# Patient Record
Sex: Male | Born: 1985 | Race: Black or African American | Hispanic: No | Marital: Single | Smoking: Never smoker
Health system: Southern US, Community
[De-identification: ages and names within clinical notes are randomized; demographics above are authoritative.]

## PROBLEM LIST (undated history)

## (undated) DIAGNOSIS — J45909 Unspecified asthma, uncomplicated: Secondary | ICD-10-CM

---

## 2011-09-24 ENCOUNTER — Encounter (HOSPITAL_BASED_OUTPATIENT_CLINIC_OR_DEPARTMENT_OTHER): Payer: Self-pay | Admitting: *Deleted

## 2011-09-24 ENCOUNTER — Emergency Department (HOSPITAL_BASED_OUTPATIENT_CLINIC_OR_DEPARTMENT_OTHER)
Admission: EM | Admit: 2011-09-24 | Discharge: 2011-09-24 | Disposition: A | Discharge status: Home / Self Care | Payer: Self-pay | Attending: Emergency Medicine | Admitting: Emergency Medicine

## 2011-09-24 DIAGNOSIS — Y9239 Other specified sports and athletic area as the place of occurrence of the external cause: Secondary | ICD-10-CM | POA: Insufficient documentation

## 2011-09-24 DIAGNOSIS — IMO0002 Reserved for concepts with insufficient information to code with codable children: Secondary | ICD-10-CM

## 2011-09-24 DIAGNOSIS — S0180XA Unspecified open wound of other part of head, initial encounter: Secondary | ICD-10-CM | POA: Insufficient documentation

## 2011-09-24 MED ORDER — LIDOCAINE HCL 2 % IJ SOLN
5.0000 mL | Freq: Once | INTRAMUSCULAR | Status: AC
  Administered 2011-09-24: 100 mg via INTRADERMAL

## 2011-09-24 MED ORDER — HYDROCODONE-ACETAMINOPHEN 5-325 MG PO TABS
1.0000 | ORAL_TABLET | Freq: Once | ORAL | Status: AC
  Administered 2011-09-24: 1 via ORAL
  Filled 2011-09-24: qty 1

## 2011-09-24 MED ORDER — LIDOCAINE-EPINEPHRINE 2 %-1:100000 IJ SOLN: 20.0000 mL | Freq: Once | INTRAMUSCULAR | Status: DC

## 2011-09-24 NOTE — ED Notes (Signed)
Pt presents to ED today with full thickness lac above right eyebrow.  EMS bandage and bleeding is controlled at present

## 2011-09-24 NOTE — Discharge Instructions (Signed)
Laceration Care, Adult °A laceration is a cut that goes through all layers of the skin. The cut goes into the tissue beneath the skin. °HOME CARE °For stitches (sutures) or staples: °· Keep the cut clean and dry.  °· If you have a bandage (dressing), change it at least once a day. Change the bandage if it gets wet or dirty, or as told by your doctor.  °· Wash the cut with soap and water 2 times a day. Rinse the cut with water. Pat it dry with a clean towel.  °· Put a thin layer of medicated cream on the cut as told by your doctor.  °· You may shower after the first 24 hours. Do not soak the cut in water until the stitches are removed.  °· Only take medicines as told by your doctor.  °· Have your stitches or staples removed as told by your doctor.  °For skin adhesive strips: °· Keep the cut clean and dry.  °· Do not get the strips wet. You may take a bath, but be careful to keep the cut dry.  °· If the cut gets wet, pat it dry with a clean towel.  °· The strips will fall off on their own. Do not remove the strips that are still stuck to the cut.  °For wound glue: °· You may shower or take baths. Do not soak or scrub the cut. Do not swim. Avoid heavy sweating until the glue falls off on its own. After a shower or bath, pat the cut dry with a clean towel.  °· Do not put medicine on your cut until the glue falls off.  °· If you have a bandage, do not put tape over the glue.  °· Avoid lots of sunlight or tanning lamps until the glue falls off. Put sunscreen on the cut for the first year to reduce your scar.  °· The glue will fall off on its own. Do not pick at the glue.  °You may need a tetanus shot if: °· You cannot remember when you had your last tetanus shot.  °· You have never had a tetanus shot.  °If you need a tetanus shot and you choose not to have one, you may get tetanus. Sickness from tetanus can be serious. °GET HELP RIGHT AWAY IF:  °· Your pain does not get better with medicine.  °· Your arm, hand, leg, or  foot loses feeling (numbness) or changes color.  °· Your cut is bleeding.  °· Your joint feels weak, or you cannot use your joint.  °· You have painful lumps on your body.  °· Your cut is red, puffy (swollen), or painful.  °· You have a red line on the skin near the cut.  °· You have yellowish-white fluid (pus) coming from the cut.  °· You have a fever.  °· You have a bad smell coming from the cut or bandage.  °· Your cut breaks open before or after stitches are removed.  °· You notice something coming out of the cut, such as wood or glass.  °· You cannot move a finger or toe.  °MAKE SURE YOU:  °· Understand these instructions.  °· Will watch your condition.  °· Will get help right away if you are not doing well or get worse.  °Document Released: 11/02/2007 Document Revised: 05/05/2011 Document Reviewed: 11/09/2010 °Exit[employer]® Patient Information ©2012 [employer]. °

## 2011-09-24 NOTE — ED Notes (Signed)
I irrigated wound with saline before PA sutured wound. I then completed wound care with bacitracin and 2x2's secured with paper tape. I cleaned remaining dried blood with hydrogen peroxide.
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
## 2011-09-29 ENCOUNTER — Encounter (HOSPITAL_BASED_OUTPATIENT_CLINIC_OR_DEPARTMENT_OTHER): Payer: Self-pay | Admitting: Emergency Medicine

## 2011-09-29 ENCOUNTER — Emergency Department (HOSPITAL_BASED_OUTPATIENT_CLINIC_OR_DEPARTMENT_OTHER)
Admission: EM | Admit: 2011-09-29 | Discharge: 2011-09-29 | Disposition: A | Discharge status: Home / Self Care | Payer: Self-pay | Attending: Emergency Medicine | Admitting: Emergency Medicine

## 2011-09-29 DIAGNOSIS — Z4802 Encounter for removal of sutures: Secondary | ICD-10-CM | POA: Insufficient documentation

## 2011-09-29 DIAGNOSIS — IMO0002 Reserved for concepts with insufficient information to code with codable children: Secondary | ICD-10-CM

## 2011-09-29 NOTE — ED Notes (Signed)
Here for suture removal

## 2011-09-29 NOTE — ED Provider Notes (Signed)
History     CSN: [id number]  Arrival date & time 09/29/11  1557   First MD Initiated Contact with Patient 09/29/11 1604      Chief Complaint  Patient presents with  . Suture / Staple Removal    (Consider location/radiation/quality/duration/timing/severity/associated sxs/prior treatment) Patient is a 26 y.o. male presenting with suture removal. The history is provided by the patient. No language interpreter was used.  Suture / Staple Removal  The sutures were placed 3 to 6 days ago. There has been no treatment since the wound repair. There has been no drainage from the wound. There is no redness present. There is no swelling present. The pain has no pain.  healed laceration forehead  History reviewed. No pertinent past medical history.  History reviewed. No pertinent past surgical history.  No family history on file.  History  Substance Use Topics  . Smoking status: Never Smoker   . Smokeless tobacco: Not on file  . Alcohol Use: No      Review of Systems  All other systems reviewed and are negative.    Allergies  Sulfa antibiotics  Home Medications   Current Outpatient Rx  Name Route Sig Dispense Refill  . ASPIRIN 325 MG PO TABS Oral Take 325 mg by mouth daily. Patient used this medication for his headache.      BP 119/68  Pulse 76  Temp(Src) 98.6 F (37 C) (Oral)  Resp 16  Ht 5\' 8"  (1.727 m)  Wt 164 lb (74.39 kg)  BMI 24.94 kg/m2  SpO2 99%  Physical Exam  Nursing note and vitals reviewed. Constitutional: He is oriented to person, place, and time. He appears well-developed and well-nourished.  HENT:  Head: Normocephalic and atraumatic.  Right Ear: External ear normal.  Left Ear: External ear normal.  Eyes: Conjunctivae are normal. Pupils are equal, round, and reactive to light.  Musculoskeletal: Normal range of motion.  Neurological: He is alert and oriented to person, place, and time. He has normal reflexes.  Skin: Skin is warm.  Psychiatric: He  has a normal mood and affect.    ED Course  Procedures (including critical care time)  Labs Reviewed - No data to display No results found.   No diagnosis found.    MDM          Elson Areas, PA 09/29/11 1606  Lonia Skinner [place], [place] 09/29/11 [address] [place], [place] 09/29/11 1646  Lonia Skinner [place], [place] 09/29/11 1647  Lonia Skinner [place], [place] 09/29/11 1649

## 2011-09-29 NOTE — Discharge Instructions (Signed)
Suture Removal You have had your sutures (stitches) removed today. This means your wound has healed well enough to take out your stitches. Be careful to protect the wound area over the next several weeks. An injury this area could cause the cut to split open again. It usually takes 1-2 years for a scar to get its full strength and loose its redness. For wounds that heal slowly, tapes may be applied to reinforce the skin for several days after the stitches are removed. You may allow the sutured area to get wet. Topical antibiotics (antibiotics you put on your skin) are not usually needed at this point. Applying vitamin E oil and aloe vera ointments may help the wound heal faster and stronger. Some scars form extra pigment with exposure to sunlight during the first 6-12 months after repair. This can be prevented by using a sun block (SPF 15-30) on the affected area. Call your doctor if you have any concerns about your injury. Call right away if you have any evidence of wound infection such as increased pain, drainage, redness, or swelling. Document Released: 06/23/2004 Document Revised: 05/05/2011 Document Reviewed: 03/07/2008 Exit[employer] Patient Information 2012 [employer]. 

## 2011-09-29 NOTE — ED Provider Notes (Signed)
Medical screening examination/treatment/procedure(s) were performed by non-physician practitioner and as supervising physician I was immediately available for consultation/collaboration.   Aysa Larivee, MD 09/29/11 1701 

## 2013-05-30 HISTORY — PX: FRACTURE SURGERY: SHX138

## 2013-06-24 ENCOUNTER — Ambulatory Visit
Admission: RE | Admit: 2013-06-24 | Discharge: 2013-06-24 | Disposition: A | Discharge status: Home / Self Care | Payer: BC Managed Care – PPO | Source: Ambulatory Visit | Attending: Physician Assistant | Admitting: Physician Assistant

## 2013-06-24 ENCOUNTER — Other Ambulatory Visit: Payer: Self-pay | Admitting: Physician Assistant

## 2013-06-24 DIAGNOSIS — T1490XA Injury, unspecified, initial encounter: Secondary | ICD-10-CM

## 2013-09-07 ENCOUNTER — Emergency Department (HOSPITAL_COMMUNITY): Payer: BC Managed Care – PPO

## 2013-09-07 ENCOUNTER — Emergency Department (HOSPITAL_COMMUNITY)
Admission: EM | Admit: 2013-09-07 | Discharge: 2013-09-08 | Disposition: A | Discharge status: Home / Self Care | Payer: BC Managed Care – PPO | Attending: Emergency Medicine | Admitting: Emergency Medicine

## 2013-09-07 ENCOUNTER — Encounter (HOSPITAL_COMMUNITY): Payer: Self-pay | Admitting: Emergency Medicine

## 2013-09-07 DIAGNOSIS — Y92838 Other recreation area as the place of occurrence of the external cause: Secondary | ICD-10-CM

## 2013-09-07 DIAGNOSIS — Y9239 Other specified sports and athletic area as the place of occurrence of the external cause: Secondary | ICD-10-CM | POA: Insufficient documentation

## 2013-09-07 DIAGNOSIS — Y9389 Activity, other specified: Secondary | ICD-10-CM | POA: Insufficient documentation

## 2013-09-07 DIAGNOSIS — Z7982 Long term (current) use of aspirin: Secondary | ICD-10-CM | POA: Insufficient documentation

## 2013-09-07 DIAGNOSIS — W010XXA Fall on same level from slipping, tripping and stumbling without subsequent striking against object, initial encounter: Secondary | ICD-10-CM | POA: Insufficient documentation

## 2013-09-07 DIAGNOSIS — S8253XA Displaced fracture of medial malleolus of unspecified tibia, initial encounter for closed fracture: Secondary | ICD-10-CM | POA: Insufficient documentation

## 2013-09-07 DIAGNOSIS — R000 Tachycardia, unspecified: Secondary | ICD-10-CM | POA: Insufficient documentation

## 2013-09-07 DIAGNOSIS — S82832A Other fracture of upper and lower end of left fibula, initial encounter for closed fracture: Secondary | ICD-10-CM

## 2013-09-07 DIAGNOSIS — S8252XA Displaced fracture of medial malleolus of left tibia, initial encounter for closed fracture: Secondary | ICD-10-CM

## 2013-09-07 DIAGNOSIS — S82839B Other fracture of upper and lower end of unspecified fibula, initial encounter for open fracture type I or II: Secondary | ICD-10-CM | POA: Insufficient documentation

## 2013-09-07 MED ORDER — HYDROCODONE-ACETAMINOPHEN 5-325 MG PO TABS
1.0000 | ORAL_TABLET | Freq: Once | ORAL | Status: AC
  Administered 2013-09-07: 1 via ORAL
  Filled 2013-09-07: qty 1

## 2013-09-07 NOTE — ED Provider Notes (Signed)
CSN: [id number]     Arrival date & time 09/07/13  2126 History  This chart was scribed for non-physician practitioner Johnnette Gourdobyn Albert, PA working with Richardean Canalavid H Yao, MD by Elveria Risingimelie Horne, ED Scribe. This patient was seen in room TR06C/TR06C and the patient's care was started at 10:31 PM.   Chief Complaint  Patient presents with  . Ankle Pain      The history is provided by the patient. No language interpreter was used.   HPI Comments: Brian Carney is a 28 y.o. male who presents to the Emergency Department complaining of sharp, throbbing left ankle pain due to injury sustained while participating in cross fit. Patient reports falling and injuring his ankle during his excercise. Patient denies taking any medication but says the pain has mildly subsided some since the incident. Patient reports numbness and tingling in the foot.    History reviewed. No pertinent past medical history. History reviewed. No pertinent past surgical history. History reviewed. No pertinent family history. History  Substance Use Topics  . Smoking status: Never Smoker   . Smokeless tobacco: Never Used  . Alcohol Use: No    Review of Systems    Allergies  Sulfa antibiotics  Home Medications   Current Outpatient Rx  Name  Route  Sig  Dispense  Refill  . aspirin 325 MG tablet   Oral   Take 325 mg by mouth daily. Patient used this medication for his headache.         . oxyCODONE-acetaminophen (PERCOCET) 5-325 MG per tablet   Oral   Take 1-2 tablets by mouth every 6 (six) hours as needed for severe pain.   15 tablet   0    Triage Vitals: BP 118/74  Pulse 102  Temp(Src) 97.5 F (36.4 C) (Oral)  Resp 18  Ht 5\' 8"  (1.727 m)  Wt 174 lb (78.926 kg)  BMI 26.46 kg/m2  SpO2 97%  Physical Exam  Nursing note and vitals reviewed. Constitutional: He is oriented to person, place, and time. He appears well-developed and well-nourished. No distress.  HENT:  Head: Normocephalic and atraumatic.  Eyes:  Conjunctivae and EOM are normal.  Neck: Normal range of motion. Neck supple.  Cardiovascular: Regular rhythm and normal heart sounds.   Tachy ~100.  Pulmonary/Chest: Effort normal and breath sounds normal.  Musculoskeletal: Normal range of motion. He exhibits tenderness. He exhibits no edema.       Left ankle: He exhibits deformity.  Significant swelling over medial and lateral malleolus. Tender over entire ankle. +2 DP and TP pulses. ROM limited by pain and swelling. No tenderness over proximal fibula.  Neurological: He is alert and oriented to person, place, and time.  Skin: Skin is warm and dry.  Psychiatric: He has a normal mood and affect. His behavior is normal.    ED Course  Procedures (including critical care time) DIAGNOSTIC STUDIES: Oxygen Saturation is 97% on room air, adequate by my interpretation.    COORDINATION OF CARE: 10:36 PM- Will order X-ray of left foot. Discussed treatment plan with patient at bedside and patient agreed to plan.    Labs Review Labs Reviewed - No data to display Imaging Review Dg Tibia/fibula Left  09/08/2013   CLINICAL DATA:  Accident during cross fit training.  EXAM: LEFT TIBIA AND FIBULA - 2 VIEW  COMPARISON:  None.  FINDINGS: Minimally displaced, comminuted fracture involves the proximal left fibula. Displaced fracture of the medial malleolus with associated soft tissue swelling is again noted.  IMPRESSION:  1. Proximal fibular fracture. 2. Medial malleolar fracture.   Electronically Signed   By: Signa Kell M.D.   On: 09/08/2013 00:50   Dg Ankle Complete Left  09/07/2013   CLINICAL DATA:  Trauma. Fell during sports with left ankle pain and swelling.  EXAM: LEFT ANKLE COMPLETE - 3+ VIEW  COMPARISON:  None.  FINDINGS: Acute fracture of the left medial malleolus with associated soft tissue swelling. Fracture fragment is displaced. There is suggestion of a tiny posterior malleolar avulsion fracture. Lateral malleolus appears intact. Widening  of the medial tibiotalar joint space.  IMPRESSION: Displaced fracture of the medial malleolus with soft tissue swelling. Suggestion of tiny avulsion of the posterior malleolus.   Electronically Signed   By: Burman Nieves M.D.   On: 09/07/2013 23:02   Dg Knee Complete 4 Views Left  09/08/2013   CLINICAL DATA:  Left ankle and tib fib pain  EXAM: LEFT KNEE - COMPLETE 4+ VIEW  COMPARISON:  None.  FINDINGS: No joint effusion. There is a comminuted fracture involving the fibula. There is mild posterior displacement of the distal fracture fragments.  IMPRESSION: 1. Comminuted fracture involves the proximal shaft of the fibula.   Electronically Signed   By: Signa Kell M.D.   On: 09/08/2013 00:31     EKG Interpretation None      MDM   Final diagnoses:  Displaced fracture of medial malleolus of left tibia  Closed fracture of fibula, proximal, left    Patient presenting with left ankle injury. He is well appearing and in no apparent distress. Neurovascularly intact. X-rays showing displaced fracture of the medial malleolus with soft tissue swelling, suggestion of tiny avulsion of the posterior malleolus. I spoke with Dr. Merlyn Lot, orthopedic surgeon on-call who suggested left knee x-ray as well despite patient not having tenderness of his proximal fibula. X-ray showing a comminuted fracture involving the proximal shaft of the fibula. He advised a posterior leg splint, nonweightbearing with crutches, followup in his office on Monday. Pain meds given. Patient stable for discharge. Return precautions given. Patient states understanding of treatment care plan and is agreeable. Case discussed with attending Dr. Silverio Lay who agrees with plan of care.   I personally performed the services described in this documentation, which was scribed in my presence. The recorded information has been reviewed and is accurate.    Brian Mace, PA-C 09/08/13 0130

## 2013-09-07 NOTE — ED Notes (Signed)
Pt states he tripped and fell while box jumping and hurt his L ankle. Edema noted to L ankle.

## 2013-09-07 NOTE — ED Notes (Signed)
Left ankle swelling noted. Pt states injury due to exercise cross fit. Pt can wiggle digits and move ankle. Skin warm, cap refill less than 2 seconds, skin color normal for race. Sensation present.

## 2013-09-08 ENCOUNTER — Emergency Department (HOSPITAL_COMMUNITY): Payer: BC Managed Care – PPO

## 2013-09-08 MED ORDER — OXYCODONE-ACETAMINOPHEN 5-325 MG PO TABS: 1.0000 | ORAL_TABLET | Freq: Four times a day (QID) | ORAL | Status: DC | PRN

## 2013-09-08 NOTE — ED Provider Notes (Signed)
Medical screening examination/treatment/procedure(s) were performed by non-physician practitioner and as supervising physician I was immediately available for consultation/collaboration.   EKG Interpretation None        Richardean Canalavid H Yao, MD 09/08/13 1510

## 2013-09-08 NOTE — Discharge Instructions (Signed)
Take percocet for severe pain only. No driving or operating heavy machinery while taking percocet. This medication may cause drowsiness.  Tibial Fracture, Ankle, Adult, Displaced, ORIF You have a fracture (break) of your tibia in the lower part of the bone that makes up part of your ankle. The tibia is the large "shin" bone in your lower leg. This is the large bump you feel at your ankle on the inside of your leg. Your fracture is displaced. This means the bones are not in their normal position and will not give a good result if they heal in that position. Because of this, surgery is required. This is called an open reduction and internal fixation (ORIF). Even with the best of care and perfect results this ankle may be more prone to be arthritis later due to damage of the cartilage lining the ankle joint which is not visible on X-ray. These fractures are easily diagnosed with X-rays. TREATMENT  You have a fracture that would probably heal with disability, without surgery. Open reduction means that the area of the fracture is opened up to the vision of the surgeon and internal fixation means that a screw or fixation device is used to hold the boney fragment (piece) in place. Following surgery a short-leg cast or removable fracture boot is then applied from below your knee to your toes. This is generally left in place for about 5 to 6 weeks, during which time it is followed by your caregiver and X-rays may be taken to make sure the bones stay in place. LET YOUR CAREGIVER KNOW ABOUT:   Allergies.  Medications taken including herbs, eye drops, over the counter medications, and creams.  Use of steroids (by mouth or creams).  History of bleeding or blood problems.  Previous problems with anesthetics or novocaine, including a family history of these problems.  Possibility of pregnancy, if this applies.  History of blood clots (thrombophlebitis).  Previous surgery.  Other health problems. RISKS AND  COMPLICATIONS All surgery is associated with risks. Some of these risks are:  Excessive bleeding.  Infection.  Failure to heal properly resulting in an unstable or arthritic ankle.  Stiffness of ankle following repair. BEFORE AND AFTER YOUR SURGERY Prior to surgery an IV (intravenous line connected to your vein for giving fluids) may be started and you will be given an anesthetic (medications and gas to make you sleep). After surgery, you will be taken to the recovery area where a nurse will monitor your progress. You may have a catheter (a long, narrow, hollow tube) in your bladder following surgery that helps you pass your water. When you are awake, are stable, taking fluids well and without complications, you will be returned to your room. You will receive physical therapy and other care until you are doing well and your caregiver feels it is safe for you to be transferred either to home or to an extended care facility. HOME CARE INSTRUCTIONS  You may resume normal diet and activities as directed or allowed.  Keep ice packs (a bag of ice wrapped in a towel) on the surgical area for 15-20 minutes, 03-04 times per day, for the first two days following surgery. Use the ice only if OK with your surgeon or caregiver.  Elevate your ankle above your heart as much as possible for the first 24-48 hrs post-operatively.  Change dressings if necessary or as directed.  If you have a plaster or fiberglass cast:  Do not try to scratch the skin under  the cast using sharp or pointed objects.  Check the skin around the cast every day. You may put lotion on any red or sore areas.  Keep your cast dry and clean.  Do not put pressure on any part of your cast or splint until it is fully hardened.  Your cast or splint can be protected during bathing with a plastic bag. Do not lower the cast or splint into water.  Only take over-the-counter or prescription medicines for pain, discomfort, or fever as  directed by your caregiver.  Use crutches as directed and do not exercise leg unless instructed.  These are not fractures to be taken lightly! If these bones become displaced and get out of position, it may eventually lead to arthritis and disability for the rest of your life. Problems often follow even the best of care. Follow the directions of your caregiver.  Keep appointments as directed. SEEK IMMEDIATE MEDICAL CARE IF:  There is redness, swelling, or increasing pain in the wound.  There is pus coming from wound.  You have an unexplained oral temperature above 102 F (38.9 C) develops.  There is a bad smell coming from the wound or dressing.  You have increasing pain coming from the area of the wound. Especially when someone else moves your toes.  The wound breaks open (edges not staying together) after sutures or staples have been removed. If you do not have a window in your cast for observing the wound, a discharge or minor bleeding may show up as a stain on the outside of your cast. Report these findings to your caregiver. Document Released: 02/05/2002 Document Revised: 08/08/2011 Document Reviewed: 08/08/2007 [facility] Patient Information 2014 [place], [place].  Ankle Fracture A fracture is a break in the bone. A cast or splint is used to protect and keep your injured bone from moving.  HOME CARE INSTRUCTIONS   Use your crutches as directed.  To lessen the swelling, keep the injured leg elevated while sitting or lying down.  Apply ice to the injury for 15-20 minutes, 03-04 times per day while awake for 2 days. Put the ice in a plastic bag and place a thin towel between the bag of ice and your cast.  If you have a plaster or fiberglass cast:  Do not try to scratch the skin under the cast using sharp or pointed objects.  Check the skin around the cast every day. You may put lotion on any red or sore areas.  Keep your cast dry and clean.  If you have a plaster  splint:  Wear the splint as directed.  You may loosen the elastic around the splint if your toes become numb, tingle, or turn cold or blue.  Do not put pressure on any part of your cast or splint; it may break. Rest your cast only on a pillow the first 24 hours until it is fully hardened.  Your cast or splint can be protected during bathing with a plastic bag. Do not lower the cast or splint into water.  Take medications as directed by your caregiver. Only take over-the-counter or prescription medicines for pain, discomfort, or fever as directed by your caregiver.  Do not drive a vehicle until your caregiver specifically tells you it is safe to do so.  If your caregiver has given you a follow-up appointment, it is very important to keep that appointment. Not keeping the appointment could result in a chronic or permanent injury, pain, and disability. If there is any  problem keeping the appointment, you must call back to this facility for assistance. SEEK IMMEDIATE MEDICAL CARE IF:   Your cast gets damaged or breaks.  You have continued severe pain or more swelling than you did before the cast was put on.  Your skin or toenails below the injury turn blue or gray, or feel cold or numb.  There is a bad smell or new stains and/or purulent (pus like) drainage coming from under the cast. If you do not have a window in your cast for observing the wound, a discharge or minor bleeding may show up as a stain on the outside of your cast. Report these findings to your caregiver. MAKE SURE YOU:   Understand these instructions.  Will watch your condition.  Will get help right away if you are not doing well or get worse. Document Released: 05/13/2000 Document Revised: 08/08/2011 Document Reviewed: 12/13/2012 [facility] Patient Information 2014 [place], [place].  Cast or Splint Care Casts and splints support injured limbs and keep bones from moving while they heal. It is important to care for your  cast or splint at home.  HOME CARE INSTRUCTIONS  Keep the cast or splint uncovered during the drying period. It can take 24 to 48 hours to dry if it is made of plaster. A fiberglass cast will dry in less than 1 hour.  Do not rest the cast on anything harder than a pillow for the first 24 hours.  Do not put weight on your injured limb or apply pressure to the cast until your health care provider gives you permission.  Keep the cast or splint dry. Wet casts or splints can lose their shape and may not support the limb as well. A wet cast that has lost its shape can also create harmful pressure on your skin when it dries. Also, wet skin can become infected.  Cover the cast or splint with a plastic bag when bathing or when out in the rain or snow. If the cast is on the trunk of the body, take sponge baths until the cast is removed.  If your cast does become wet, dry it with a towel or a blow dryer on the cool setting only.  Keep your cast or splint clean. Soiled casts may be wiped with a moistened cloth.  Do not place any hard or soft foreign objects under your cast or splint, such as cotton, toilet paper, lotion, or powder.  Do not try to scratch the skin under the cast with any object. The object could get stuck inside the cast. Also, scratching could lead to an infection. If itching is a problem, use a blow dryer on a cool setting to relieve discomfort.  Do not trim or cut your cast or remove padding from inside of it.  Exercise all joints next to the injury that are not immobilized by the cast or splint. For example, if you have a long leg cast, exercise the hip joint and toes. If you have an arm cast or splint, exercise the shoulder, elbow, thumb, and fingers.  Elevate your injured arm or leg on 1 or 2 pillows for the first 1 to 3 days to decrease swelling and pain.It is best if you can comfortably elevate your cast so it is higher than your heart. SEEK MEDICAL CARE IF:   Your cast or  splint cracks.  Your cast or splint is too tight or too loose.  You have unbearable itching inside the cast.  Your cast becomes  wet or develops a soft spot or area.  You have a bad smell coming from inside your cast.  You get an object stuck under your cast.  Your skin around the cast becomes red or raw.  You have new pain or worsening pain after the cast has been applied. SEEK IMMEDIATE MEDICAL CARE IF:   You have fluid leaking through the cast.  You are unable to move your fingers or toes.  You have discolored (blue or white), cool, painful, or very swollen fingers or toes beyond the cast.  You have tingling or numbness around the injured area.  You have severe pain or pressure under the cast.  You have any difficulty with your breathing or have shortness of breath.  You have chest pain. Document Released: 05/13/2000 Document Revised: 03/06/2013 Document Reviewed: 11/22/2012 [facility] Patient Information 2014 [place], [place].

## 2013-09-08 NOTE — Progress Notes (Signed)
Orthopedic Tech Progress Note Patient Details:  Brian CornerKelvin Carney 03-12-1986 [id number]  Ortho Devices Type of Ortho Device: Crutches;Post (short leg) splint   Brian Carney 09/08/2013, 1:03 AM

## 2013-09-12 ENCOUNTER — Other Ambulatory Visit: Payer: Self-pay | Admitting: Orthopedic Surgery

## 2013-09-12 DIAGNOSIS — S82899A Other fracture of unspecified lower leg, initial encounter for closed fracture: Secondary | ICD-10-CM

## 2013-09-13 ENCOUNTER — Ambulatory Visit
Admission: RE | Admit: 2013-09-13 | Discharge: 2013-09-13 | Disposition: A | Discharge status: Home / Self Care | Payer: BC Managed Care – PPO | Source: Ambulatory Visit | Attending: Orthopedic Surgery | Admitting: Orthopedic Surgery

## 2013-09-13 DIAGNOSIS — S82899A Other fracture of unspecified lower leg, initial encounter for closed fracture: Secondary | ICD-10-CM

## 2013-09-18 ENCOUNTER — Encounter (HOSPITAL_COMMUNITY): Payer: Self-pay

## 2013-09-18 ENCOUNTER — Encounter (HOSPITAL_COMMUNITY): Payer: Self-pay | Admitting: *Deleted

## 2013-09-19 ENCOUNTER — Ambulatory Visit (HOSPITAL_COMMUNITY): Payer: BC Managed Care – PPO

## 2013-09-19 ENCOUNTER — Ambulatory Visit (HOSPITAL_COMMUNITY)
Admission: RE | Admit: 2013-09-19 | Discharge: 2013-09-19 | Disposition: A | Discharge status: Home / Self Care | Payer: BC Managed Care – PPO | Source: Ambulatory Visit | Attending: Orthopedic Surgery | Admitting: Orthopedic Surgery

## 2013-09-19 ENCOUNTER — Encounter (HOSPITAL_COMMUNITY)
Admission: RE | Disposition: A | Discharge status: Home / Self Care | Payer: Self-pay | Source: Ambulatory Visit | Attending: Orthopedic Surgery

## 2013-09-19 ENCOUNTER — Ambulatory Visit (HOSPITAL_COMMUNITY): Payer: BC Managed Care – PPO | Admitting: Certified Registered"

## 2013-09-19 ENCOUNTER — Encounter (HOSPITAL_COMMUNITY): Payer: Self-pay | Admitting: *Deleted

## 2013-09-19 ENCOUNTER — Encounter (HOSPITAL_COMMUNITY): Payer: BC Managed Care – PPO | Admitting: Certified Registered"

## 2013-09-19 DIAGNOSIS — S82409A Unspecified fracture of shaft of unspecified fibula, initial encounter for closed fracture: Secondary | ICD-10-CM | POA: Insufficient documentation

## 2013-09-19 DIAGNOSIS — X58XXXA Exposure to other specified factors, initial encounter: Secondary | ICD-10-CM

## 2013-09-19 DIAGNOSIS — X503XXA Overexertion from repetitive movements, initial encounter: Secondary | ICD-10-CM | POA: Insufficient documentation

## 2013-09-19 DIAGNOSIS — S93439A Sprain of tibiofibular ligament of unspecified ankle, initial encounter: Secondary | ICD-10-CM | POA: Insufficient documentation

## 2013-09-19 HISTORY — PX: ORIF ANKLE FRACTURE: SHX5408

## 2013-09-19 HISTORY — PX: ARTHROTOMY: SHX5728

## 2013-09-19 LAB — CBC
HCT: 37.8 % — ABNORMAL LOW (ref 39.0–52.0)
Hemoglobin: 13.5 g/dL (ref 13.0–17.0)
MCH: 31 pg (ref 26.0–34.0)
MCHC: 35.7 g/dL (ref 30.0–36.0)
MCV: 86.7 fL (ref 78.0–100.0)
PLATELETS: 231 10*3/uL (ref 150–400)
RBC: 4.36 MIL/uL (ref 4.22–5.81)
RDW: 12.3 % (ref 11.5–15.5)
WBC: 4.7 10*3/uL (ref 4.0–10.5)

## 2013-09-19 SURGERY — OPEN REDUCTION INTERNAL FIXATION (ORIF) ANKLE FRACTURE
Anesthesia: General | Site: Ankle | Laterality: Left

## 2013-09-19 MED ORDER — SUCCINYLCHOLINE CHLORIDE 20 MG/ML IJ SOLN
INTRAMUSCULAR | Status: AC
  Filled 2013-09-19: qty 1

## 2013-09-19 MED ORDER — SODIUM CHLORIDE 0.9 % IJ SOLN
INTRAMUSCULAR | Status: AC
  Filled 2013-09-19: qty 10

## 2013-09-19 MED ORDER — PROPOFOL 10 MG/ML IV BOLUS
INTRAVENOUS | Status: DC | PRN
  Administered 2013-09-19: 200 mg via INTRAVENOUS

## 2013-09-19 MED ORDER — HYDROMORPHONE HCL PF 1 MG/ML IJ SOLN
0.2500 mg | INTRAMUSCULAR | Status: DC | PRN
Start: 2013-09-19 — End: 2013-09-19
  Administered 2013-09-19 (×4): 0.5 mg via INTRAVENOUS

## 2013-09-19 MED ORDER — OXYCODONE HCL 5 MG PO TABS
ORAL_TABLET | ORAL | Status: AC
  Administered 2013-09-19: 5 mg via ORAL
  Filled 2013-09-19: qty 1

## 2013-09-19 MED ORDER — METHOCARBAMOL 500 MG PO TABS
500.0000 mg | ORAL_TABLET | Freq: Four times a day (QID) | ORAL | Status: AC | PRN
  Administered 2013-09-19: 500 mg via ORAL

## 2013-09-19 MED ORDER — MIDAZOLAM HCL 2 MG/2ML IJ SOLN
INTRAMUSCULAR | Status: AC
  Administered 2013-09-19: 2 mg via INTRAVENOUS
  Filled 2013-09-19: qty 2

## 2013-09-19 MED ORDER — HYDROMORPHONE HCL PF 1 MG/ML IJ SOLN
INTRAMUSCULAR | Status: AC
  Administered 2013-09-19: 0.5 mg via INTRAVENOUS
  Filled 2013-09-19: qty 2

## 2013-09-19 MED ORDER — MEPERIDINE HCL 25 MG/ML IJ SOLN: 6.2500 mg | INTRAMUSCULAR | Status: DC | PRN

## 2013-09-19 MED ORDER — LACTATED RINGERS IV SOLN: INTRAVENOUS | Status: DC

## 2013-09-19 MED ORDER — PROPOFOL 10 MG/ML IV BOLUS
INTRAVENOUS | Status: AC
  Filled 2013-09-19: qty 20

## 2013-09-19 MED ORDER — LACTATED RINGERS IV SOLN
INTRAVENOUS | Status: DC
  Administered 2013-09-19: 50 mL/h via INTRAVENOUS

## 2013-09-19 MED ORDER — OXYCODONE HCL 5 MG PO TABS
5.0000 mg | ORAL_TABLET | Freq: Once | ORAL | Status: AC | PRN
  Administered 2013-09-19: 5 mg via ORAL

## 2013-09-19 MED ORDER — METHOCARBAMOL 500 MG PO TABS
ORAL_TABLET | ORAL | Status: AC
  Administered 2013-09-19: 500 mg via ORAL
  Filled 2013-09-19: qty 1

## 2013-09-19 MED ORDER — LIDOCAINE HCL (CARDIAC) 20 MG/ML IV SOLN
INTRAVENOUS | Status: DC | PRN
  Administered 2013-09-19: 20 mg via INTRAVENOUS
  Administered 2013-09-19: 100 mg via INTRAVENOUS

## 2013-09-19 MED ORDER — SUFENTANIL CITRATE 50 MCG/ML IV SOLN
INTRAVENOUS | Status: AC
  Filled 2013-09-19: qty 1

## 2013-09-19 MED ORDER — FENTANYL CITRATE 0.05 MG/ML IJ SOLN
INTRAMUSCULAR | Status: AC
  Administered 2013-09-19: 100 ug via INTRAVENOUS
  Filled 2013-09-19: qty 2

## 2013-09-19 MED ORDER — LIDOCAINE-EPINEPHRINE (PF) 1.5 %-1:200000 IJ SOLN
INTRAMUSCULAR | Status: DC | PRN
  Administered 2013-09-19: 30 mL via PERINEURAL

## 2013-09-19 MED ORDER — ONDANSETRON HCL 4 MG/2ML IJ SOLN
INTRAMUSCULAR | Status: DC | PRN
  Administered 2013-09-19: 4 mg via INTRAVENOUS

## 2013-09-19 MED ORDER — LIDOCAINE HCL (CARDIAC) 20 MG/ML IV SOLN
INTRAVENOUS | Status: AC
  Filled 2013-09-19: qty 5

## 2013-09-19 MED ORDER — ONDANSETRON HCL 4 MG/2ML IJ SOLN
INTRAMUSCULAR | Status: AC
  Filled 2013-09-19: qty 2

## 2013-09-19 MED ORDER — ONDANSETRON HCL 4 MG/2ML IJ SOLN: 4.0000 mg | Freq: Once | INTRAMUSCULAR | Status: DC | PRN

## 2013-09-19 MED ORDER — BUPIVACAINE-EPINEPHRINE PF 0.5-1:200000 % IJ SOLN
INTRAMUSCULAR | Status: DC | PRN
  Administered 2013-09-19: 30 mL via PERINEURAL

## 2013-09-19 MED ORDER — ACETAMINOPHEN 500 MG PO TABS
1000.0000 mg | ORAL_TABLET | Freq: Once | ORAL | Status: AC
  Administered 2013-09-19: 1000 mg via ORAL
  Filled 2013-09-19: qty 2

## 2013-09-19 MED ORDER — OXYCODONE HCL 5 MG PO TABS: 5.0000 mg | ORAL_TABLET | ORAL | Status: DC | PRN

## 2013-09-19 MED ORDER — MIDAZOLAM HCL 2 MG/2ML IJ SOLN
INTRAMUSCULAR | Status: AC
  Filled 2013-09-19: qty 2

## 2013-09-19 MED ORDER — DIPHENHYDRAMINE HCL 50 MG/ML IJ SOLN
12.5000 mg | Freq: Four times a day (QID) | INTRAMUSCULAR | Status: AC | PRN
  Administered 2013-09-19: 12.5 mg via INTRAVENOUS

## 2013-09-19 MED ORDER — ONDANSETRON HCL 4 MG PO TABS: 4.0000 mg | ORAL_TABLET | Freq: Three times a day (TID) | ORAL | Status: DC | PRN

## 2013-09-19 MED ORDER — 0.9 % SODIUM CHLORIDE (POUR BTL) OPTIME
TOPICAL | Status: DC | PRN
  Administered 2013-09-19: 1000 mL

## 2013-09-19 MED ORDER — ROCURONIUM BROMIDE 50 MG/5ML IV SOLN
INTRAVENOUS | Status: AC
  Filled 2013-09-19: qty 1

## 2013-09-19 MED ORDER — DIPHENHYDRAMINE HCL 50 MG/ML IJ SOLN
INTRAMUSCULAR | Status: AC
  Administered 2013-09-19: 12.5 mg via INTRAVENOUS
  Filled 2013-09-19: qty 1

## 2013-09-19 MED ORDER — METHOCARBAMOL 500 MG PO TABS: 500.0000 mg | ORAL_TABLET | Freq: Four times a day (QID) | ORAL | Status: DC | PRN

## 2013-09-19 MED ORDER — CEFAZOLIN SODIUM-DEXTROSE 2-3 GM-% IV SOLR
2.0000 g | INTRAVENOUS | Status: AC
  Administered 2013-09-19: 2 g via INTRAVENOUS
  Filled 2013-09-19: qty 50

## 2013-09-19 MED ORDER — OXYCODONE-ACETAMINOPHEN 5-325 MG PO TABS: 1.0000 | ORAL_TABLET | Freq: Four times a day (QID) | ORAL | Status: DC | PRN

## 2013-09-19 MED ORDER — OXYCODONE HCL 5 MG/5ML PO SOLN: 5.0000 mg | Freq: Once | ORAL | Status: AC | PRN

## 2013-09-19 SURGICAL SUPPLY — 67 items
BANDAGE ELASTIC 4 VELCRO ST LF (GAUZE/BANDAGES/DRESSINGS) ×3 IMPLANT
BANDAGE ELASTIC 6 VELCRO ST LF (GAUZE/BANDAGES/DRESSINGS) ×3 IMPLANT
BANDAGE ESMARK 6X9 LF (GAUZE/BANDAGES/DRESSINGS) ×1 IMPLANT
BANDAGE GAUZE ELAST BULKY 4 IN (GAUZE/BANDAGES/DRESSINGS) ×6 IMPLANT
BIT DRILL 2.5X110 QC LCP DISP (BIT) ×3 IMPLANT
BLADE SURG 10 STRL SS (BLADE) ×3 IMPLANT
BNDG COHESIVE 4X5 TAN STRL (GAUZE/BANDAGES/DRESSINGS) ×3 IMPLANT
BNDG ESMARK 6X9 LF (GAUZE/BANDAGES/DRESSINGS) ×3
BNDG GAUZE ELAST 4 BULKY (GAUZE/BANDAGES/DRESSINGS) ×3 IMPLANT
BRUSH SCRUB DISP (MISCELLANEOUS) ×9 IMPLANT
COVER SURGICAL LIGHT HANDLE (MISCELLANEOUS) ×6 IMPLANT
DRAPE C-ARM 42X72 X-RAY (DRAPES) IMPLANT
DRAPE C-ARMOR (DRAPES) ×6 IMPLANT
DRAPE ORTHO SPLIT 77X108 STRL (DRAPES) ×6
DRAPE PROXIMA HALF (DRAPES) ×3 IMPLANT
DRAPE SURG ORHT 6 SPLT 77X108 (DRAPES) ×3 IMPLANT
DRAPE U-SHAPE 47X51 STRL (DRAPES) ×3 IMPLANT
DRSG EMULSION OIL 3X3 NADH (GAUZE/BANDAGES/DRESSINGS) ×3 IMPLANT
ELECT REM PT RETURN 9FT ADLT (ELECTROSURGICAL) ×3
ELECTRODE REM PT RTRN 9FT ADLT (ELECTROSURGICAL) ×1 IMPLANT
GLOVE BIO SURGEON STRL SZ7.5 (GLOVE) ×3 IMPLANT
GLOVE BIO SURGEON STRL SZ8 (GLOVE) ×3 IMPLANT
GLOVE BIOGEL PI IND STRL 7.5 (GLOVE) ×3 IMPLANT
GLOVE BIOGEL PI IND STRL 8 (GLOVE) ×1 IMPLANT
GLOVE BIOGEL PI INDICATOR 7.5 (GLOVE) ×6
GLOVE BIOGEL PI INDICATOR 8 (GLOVE) ×2
GLOVE SURG SS PI 6.5 STRL IVOR (GLOVE) ×6 IMPLANT
GLOVE SURG SS PI 7.0 STRL IVOR (GLOVE) ×12 IMPLANT
GOWN STRL REUS W/ TWL LRG LVL3 (GOWN DISPOSABLE) ×2 IMPLANT
GOWN STRL REUS W/ TWL XL LVL3 (GOWN DISPOSABLE) ×1 IMPLANT
GOWN STRL REUS W/TWL LRG LVL3 (GOWN DISPOSABLE) ×4
GOWN STRL REUS W/TWL XL LVL3 (GOWN DISPOSABLE) ×2
KIT 1/3 TUB PL 2H 25M (Orthopedic Implant) ×1 IMPLANT
KIT BASIN OR (CUSTOM PROCEDURE TRAY) ×3 IMPLANT
KIT ROOM TURNOVER OR (KITS) ×3 IMPLANT
MANIFOLD NEPTUNE II (INSTRUMENTS) ×3 IMPLANT
NEEDLE HYPO 21X1.5 SAFETY (NEEDLE) IMPLANT
NS IRRIG 1000ML POUR BTL (IV SOLUTION) ×3 IMPLANT
PACK GENERAL/GYN (CUSTOM PROCEDURE TRAY) ×3 IMPLANT
PAD ARMBOARD 7.5X6 YLW CONV (MISCELLANEOUS) ×6 IMPLANT
PAD CAST 4YDX4 CTTN HI CHSV (CAST SUPPLIES) ×2 IMPLANT
PADDING CAST COTTON 4X4 STRL (CAST SUPPLIES) ×4
PADDING CAST COTTON 6X4 STRL (CAST SUPPLIES) ×6 IMPLANT
PENCIL BUTTON HOLSTER BLD 10FT (ELECTRODE) ×3 IMPLANT
PROS 1/3 TUB PL 2H 25M (Orthopedic Implant) ×3 IMPLANT
SCREW CORTEX 3.5 40MM (Screw) ×2 IMPLANT
SCREW CORTEX 3.5 45MM (Screw) ×3 IMPLANT
SCREW LOCK CORT ST 3.5X40 (Screw) ×1 IMPLANT
SPONGE GAUZE 4X4 12PLY (GAUZE/BANDAGES/DRESSINGS) IMPLANT
SPONGE GAUZE 4X4 12PLY STER LF (GAUZE/BANDAGES/DRESSINGS) ×3 IMPLANT
SPONGE LAP 18X18 X RAY DECT (DISPOSABLE) ×9 IMPLANT
SPONGE SCRUB IODOPHOR (GAUZE/BANDAGES/DRESSINGS) ×3 IMPLANT
STAPLER VISISTAT 35W (STAPLE) IMPLANT
SUCTION FRAZIER TIP 10 FR DISP (SUCTIONS) ×3 IMPLANT
SUT ETHILON 2 0 FS 18 (SUTURE) ×9 IMPLANT
SUT ETHILON 3 0 PS 1 (SUTURE) ×9 IMPLANT
SUT PDS AB 2-0 CT1 27 (SUTURE) IMPLANT
SUT VIC AB 2-0 CT1 27 (SUTURE) ×4
SUT VIC AB 2-0 CT1 TAPERPNT 27 (SUTURE) ×2 IMPLANT
SUT VIC AB 2-0 CT3 27 (SUTURE) ×3 IMPLANT
SYR CONTROL 10ML LL (SYRINGE) IMPLANT
TOWEL OR 17X24 6PK STRL BLUE (TOWEL DISPOSABLE) ×3 IMPLANT
TOWEL OR 17X26 10 PK STRL BLUE (TOWEL DISPOSABLE) ×6 IMPLANT
TUBE CONNECTING 12'X1/4 (SUCTIONS) ×1
TUBE CONNECTING 12X1/4 (SUCTIONS) ×2 IMPLANT
UNDERPAD 30X30 INCONTINENT (UNDERPADS AND DIAPERS) ×3 IMPLANT
WATER STERILE IRR 1000ML POUR (IV SOLUTION) ×3 IMPLANT

## 2013-09-19 NOTE — Brief Op Note (Signed)
09/19/2013  11:36 AM  PATIENT:  [name]  28 y.o. male  PRE-OPERATIVE DIAGNOSIS:  LEFT SYNDESMOTIC DISRUPTION, INTRA-ARTICULAR FRAGMENT ANKLE  POST-OPERATIVE DIAGNOSIS:  LEFT SYNDESMODIC DISRUPTION, INTRA-ARTICULAR FRAGMENT ANKLE  PROCEDURE:  Procedure(s): 1. OPEN REDUCTIN INTERNAL FIXATION LEFT SYNDESMOSIS  (Left) 2. ANKLE ARTHROTOMY (Left)  SURGEON:  Surgeon(s) and Role:    * Budd PalmerMichael H Kura Bethards, MD - Primary  ANESTHESIA:   general  I/O:  Total I/O In: 800 [I.V.:800] Out: -   SPECIMEN:  No Specimen  TOURNIQUET:  * No tourniquets in log *  DICTATION: .Other Dictation: Dictation Number [id number]

## 2013-09-19 NOTE — Discharge Instructions (Signed)

## 2013-09-19 NOTE — Anesthesia Procedure Notes (Signed)
Anesthesia Regional Block:  Popliteal block  Pre-Anesthetic Checklist: ,, timeout performed, Correct Patient, Correct Site, Correct Laterality, Correct Procedure, Correct Position, site marked, Risks and benefits discussed,  Surgical consent,  Pre-op evaluation,  At surgeon's request and post-op pain management  Laterality: Left  Prep: chloraprep       Needles:  Injection technique: Single-shot  Needle Type: Echogenic Stimulator Needle     Needle Length: 9cm 9 cm Needle Gauge: 21 and 21 G    Additional Needles:  Procedures: ultrasound guided (picture in chart) and nerve stimulator Popliteal block  Nerve Stimulator or Paresthesia:  Response: 0.4 mA,   Additional Responses:   Narrative:  Start time: 09/19/2013 9:30 AM End time: 09/19/2013 9:45 AM Injection made incrementally with aspirations every 5 mL.  Performed by: Personally  Anesthesiologist: Arta BruceKevin Rama Sorci MD  Additional Notes: Monitors applied. Patient sedated. Sterile prep and drape,hand hygiene and sterile gloves were used. Relevant anatomy identified.Needle position confirmed.Local anesthetic injected incrementally after negative aspiration. Local anesthetic spread visualized around nerve(s). Vascular puncture avoided. No complications. Image printed for medical record.The patient tolerated the procedure well.  Additional Saphenous nerve block performed. 15cc Local Anesthetic mixture placed under ultrasonic guidance along the medio-inferior border of the Sartorious muscle 6 inches above the knee.  No Problems encountered.  Arta BruceKevin Norberto Wishon MD

## 2013-09-19 NOTE — H&P (Signed)
[facility] H&P  Chief Complaint: L syndesmotic disruption HPI:  28 y/o black male injured while doing box jumps. Found to have syndesmotic disruption. Presents today for fixation   Past Medical History  Diagnosis Date  . Medical history non-contributory     Past Surgical History  Procedure Laterality Date  . Fracture surgery  05/2013    right ring finger - pins    Family History  Problem Relation Age of Onset  . Hypertension Mother    Social History:  reports that he has never smoked. He has never used smokeless tobacco. He reports that he does not drink alcohol or use illicit drugs.  Allergies:  Allergies  Allergen Reactions  . Sulfa Antibiotics Swelling    Medications Prior to Admission  Medication Sig Dispense Refill  . ibuprofen (ADVIL,MOTRIN) 200 MG tablet Take 200-400 mg by mouth every 6 (six) hours as needed for mild pain.      . naproxen sodium (ANAPROX) 220 MG tablet Take 220-440 mg by mouth 2 (two) times daily as needed (pain).        No results found for this or any previous visit (from the past 48 hour(s)). No results found.  Review of Systems  Constitutional: Negative for fever and chills.  Eyes: Negative for blurred vision.  Respiratory: Negative for shortness of breath and wheezing.   Cardiovascular: Negative for chest pain and palpitations.  Gastrointestinal: Negative for nausea, vomiting and abdominal pain.  Genitourinary: Negative for dysuria.  Musculoskeletal:       L ankle pain   Neurological: Negative for tingling, sensory change and headaches.    There were no vitals taken for this visit. Physical Exam  Constitutional: He is oriented to person, place, and time. He appears well-developed.  HENT:  Head: Normocephalic and atraumatic.  Eyes: EOM are normal. Pupils are equal, round, and reactive to light.  Neck: Normal range of motion. Neck supple.  Cardiovascular: Normal rate and regular rhythm.   Respiratory: Effort normal  and breath sounds normal. He has no wheezes. He has no rales.  GI: Soft. There is no tenderness.  Musculoskeletal:  L ankle   Splinted   + swelling but stable    DPN, SPN, TN sensation intact    EHL, FHL,lesser toe motor function intact    Ext warm     + DP pulse, no dct     Compartments soft and NT    ttp L ankle, no medial tendernes  Neurological: He is alert and oriented to person, place, and time.  Skin: Skin is warm.  Psychiatric: He has a normal mood and affect. His behavior is normal. Thought content normal.    Imaging  Xray L ankle- widened medial clear space and dec tib-fib overlab, corticated medial fragment CT L ankle- widened syndesmosis, probable old medial avulsion fragment   Assessment/Plan  28 y/o male with L syndesmotic disruption  OR for ORIF  NWB x 8 weeks outpt procedure  Risks and benefits reviewed, pt wishes to proceed   Mearl LatinKeith W Fiona Coto PA-C 09/19/2013, 7:38 AM

## 2013-09-19 NOTE — Anesthesia Preprocedure Evaluation (Addendum)
Anesthesia Evaluation  Patient identified by MRN, date of birth, ID band Patient awake    Reviewed: Allergy & Precautions, H&P , NPO status , Patient's Chart, lab work & pertinent test results  Airway Mallampati: I TM Distance: >3 FB Neck ROM: Full    Dental  (+) Teeth Intact, Dental Advisory Given   Pulmonary          Cardiovascular     Neuro/Psych    GI/Hepatic   Endo/Other    Renal/GU      Musculoskeletal   Abdominal   Peds  Hematology   Anesthesia Other Findings   Reproductive/Obstetrics                           Anesthesia Physical Anesthesia Plan  ASA: I  Anesthesia Plan: General   Post-op Pain Management:    Induction: Intravenous  Airway Management Planned: LMA  Additional Equipment: None  Intra-op Plan:   Post-operative Plan: Extubation in OR  Informed Consent: I have reviewed the patients History and Physical, chart, labs and discussed the procedure including the risks, benefits and alternatives for the proposed anesthesia with the patient or authorized representative who has indicated his/her understanding and acceptance.   Dental advisory given  Plan Discussed with: CRNA and Surgeon  Anesthesia Plan Comments:        Anesthesia Quick Evaluation  

## 2013-09-19 NOTE — H&P (Signed)
I saw and examined the patient with Mr. Renae Fickleaul, communicating the findings and plan noted above.  I discussed with the patient the risks and benefits of surgery, including the possibility of failure of repair, infection, nerve injury, vessel injury, wound breakdown, arthritis, symptomatic hardware, hardware failure/ breakage, DVT/ PE, loss of motion, and need for further surgery among others.  He understood these risks and wished to proceed.  Myrene GalasMichael Antoinne Spadaccini, MD [facility] [phone] (p)

## 2013-09-19 NOTE — Anesthesia Postprocedure Evaluation (Signed)
  Anesthesia Post-op Note  Patient: Brian CornerKelvin Groeneveld  Procedure(s) Performed: Procedure(s): OPEN REDUCTIN INTERNAL FIXATION LEFT SYNDESMOSIS  (Left) ANKLE ARTHROTOMY (Left)  Patient Location: PACU  Anesthesia Type:General and block  Level of Consciousness: awake and alert   Airway and Oxygen Therapy: Patient Spontanous Breathing  Post-op Pain: moderate  Post-op Assessment: Post-op Vital signs reviewed, Patient's Cardiovascular Status Stable and Respiratory Function Stable  Post-op Vital Signs: Reviewed  Filed Vitals:   09/19/13 1200  BP: 158/109  Pulse:   Temp:   Resp:     Complications: No apparent anesthesia complications

## 2013-09-19 NOTE — Transfer of Care (Signed)
Immediate Anesthesia Transfer of Care Note  Patient: Brian CornerKelvin Fuston  Procedure(s) Performed: Procedure(s): OPEN REDUCTIN INTERNAL FIXATION LEFT SYNDESMOSIS  (Left) ANKLE ARTHROTOMY (Left)  Patient Location: PACU  Anesthesia Type:General and Regional  Level of Consciousness: awake, alert , oriented, patient cooperative and responds to stimulation  Airway & Oxygen Therapy: Patient Spontanous Breathing and Patient connected to nasal cannula oxygen  Post-op Assessment: Report given to PACU RN and Post -op Vital signs reviewed and stable  Post vital signs: Reviewed and stable  Complications: No apparent anesthesia complications

## 2013-09-20 NOTE — Op Note (Signed)
NAMMichaela Corner:  Goffe, Jamarion               ACCOUNT NO.:  [id number]  MEDICAL RECORD NO.:  [id number]  LOCATION:  [facility]                         FACILITY:  [facility]  PHYSICIAN:  Doralee AlbinoMichael H. Carola FrostHandy, M.D. DATE OF BIRTH:  07/09/1985  DATE OF PROCEDURE:  09/19/2013 DATE OF DISCHARGE:  09/19/2013                              OPERATIVE REPORT   PREOPERATIVE DIAGNOSIS: 1. Left syndesmotic disruption. 2. Intra-articular fragment of the ankle joint.  POSTOPERATIVE DIAGNOSES: 1. Left syndesmotic disruption. 2. Intra-articular fragment of the ankle joint.  PROCEDURES: 1. Arthrotomy of left ankle joint with removal of bone fragment. 2. ORIF of left ankle syndesmosis.  SURGEON:  Doralee AlbinoMichael H. Carola FrostHandy, MD  ASSISTANT:  None.  ANESTHESIA:  General.  COMPLICATIONS:  None.  DISPOSITION:  To PACU.  CONDITION:  Stable.  BRIEF SUMMARY OF INDICATIONS FOR PROCEDURE:  Gerre PebblesKevin Kotas is a very pleasant 28 year old male, who sustained a fibular shaft fracture with disruption of his syndesmosis.  I discussed with him the risks and benefits of the surgical repair and also on CT scan.  We identified and discussed the intra-articular fragment and will be required arthrotomy. He understood the possibility of other osteochondral lesions and need for further surgery, DVT, PE, and many others.  After full discussion, he wished to proceed.  BRIEF SUMMARY OF PROCEDURE:  Mr. Maisie Fushomas is given 2 g of Ancef and taken to the operating room and general anesthesia was induced.  His left lower extremity prepped and draped in usual fashion.  Tourniquet was placed about the thigh, but but never inflated during the procedure.  I began with a anteromedial arthrotomy.  I continued dissection down to the joint, which had debris removed with a rongeur, careful evaluation of the articular surface was undertaken.  There was some minor scrapes along the anteromedial edge, but no full-thickness lesions were grossly apparent.  I then  distracted the ankle joint and I irrigated thoroughly in pulsatile fashion with flexion-extension.  The arthrotomy was then closed with 2-0 Vicryl for the subcu and 3-0 nylon for the skin.  Attention was then turned to the syndesmosis, here a 2.5 cm incision was made over the posterolateral aspect of the fibula.  This was then made medially.  A 2 hole plate was selected as a washer for the syndesmotic fixation and then screws were placed after reduction was obtained and then maintained with a large [employer] clamp.  This was checked on all 3 views with fluoro.  Two tricortical screws were then placed securing this reduction.  The wound was irrigated and closed with a standard layered fashion using 2-0 Vicryl, 3-0 nylon.  Sterile gently compressive dressing was applied and placed on stirrup splint.  The patient was awakened anesthesia and transported to the PACU in stable condition.  PROGNOSIS:  Mikey CollegeKelvin will be nonweightbearing with unrestricted motion of the knee and then in 2 weeks we will begin unrestricted motion of the ankle as well.  Nonweightbearing will continue to 8 weeks with graduated weightbearing thereafter.  Given the severity of disruption, but which we could see complete windshield wipering of the fibula.  He does have a slightly increased chance of loss of reduction, and this was induced  increased substantially should he be noncompliant.  He will have DVT prophylaxis with aspirin should not require any other pharmacologic treatment as we anticipate early mobility.     Doralee AlbinoMichael H. Carola FrostHandy, M.D.     [facility]MEDQ  D:  09/19/2013  T:  09/20/2013  Job:  [id number]

## 2013-09-24 ENCOUNTER — Encounter (HOSPITAL_COMMUNITY): Payer: Self-pay | Admitting: Orthopedic Surgery

## 2014-05-21 ENCOUNTER — Other Ambulatory Visit (HOSPITAL_COMMUNITY): Payer: Self-pay | Admitting: Orthopaedic Surgery

## 2014-05-26 ENCOUNTER — Encounter (HOSPITAL_COMMUNITY): Payer: Self-pay | Admitting: *Deleted

## 2014-05-26 MED ORDER — CEFAZOLIN SODIUM-DEXTROSE 2-3 GM-% IV SOLR
2.0000 g | INTRAVENOUS | Status: AC
  Administered 2014-05-27: 2 g via INTRAVENOUS
  Filled 2014-05-26: qty 50

## 2014-05-27 ENCOUNTER — Encounter (HOSPITAL_COMMUNITY)
Admission: RE | Disposition: A | Discharge status: Home / Self Care | Payer: Self-pay | Source: Ambulatory Visit | Attending: Orthopaedic Surgery

## 2014-05-27 ENCOUNTER — Ambulatory Visit (HOSPITAL_COMMUNITY): Payer: BC Managed Care – PPO | Admitting: Certified Registered Nurse Anesthetist

## 2014-05-27 ENCOUNTER — Encounter (HOSPITAL_COMMUNITY): Payer: Self-pay | Admitting: *Deleted

## 2014-05-27 ENCOUNTER — Ambulatory Visit (HOSPITAL_COMMUNITY)
Admission: RE | Admit: 2014-05-27 | Discharge: 2014-05-27 | Disposition: A | Discharge status: Home / Self Care | Payer: BC Managed Care – PPO | Source: Ambulatory Visit | Attending: Orthopaedic Surgery | Admitting: Orthopaedic Surgery

## 2014-05-27 DIAGNOSIS — T8484XA Pain due to internal orthopedic prosthetic devices, implants and grafts, initial encounter: Secondary | ICD-10-CM | POA: Diagnosis not present

## 2014-05-27 DIAGNOSIS — Y838 Other surgical procedures as the cause of abnormal reaction of the patient, or of later complication, without mention of misadventure at the time of the procedure: Secondary | ICD-10-CM | POA: Insufficient documentation

## 2014-05-27 DIAGNOSIS — Z791 Long term (current) use of non-steroidal anti-inflammatories (NSAID): Secondary | ICD-10-CM | POA: Diagnosis not present

## 2014-05-27 DIAGNOSIS — J45909 Unspecified asthma, uncomplicated: Secondary | ICD-10-CM | POA: Diagnosis not present

## 2014-05-27 DIAGNOSIS — T8489XA Other specified complication of internal orthopedic prosthetic devices, implants and grafts, initial encounter: Secondary | ICD-10-CM | POA: Diagnosis present

## 2014-05-27 DIAGNOSIS — Y929 Unspecified place or not applicable: Secondary | ICD-10-CM | POA: Insufficient documentation

## 2014-05-27 DIAGNOSIS — T8484XS Pain due to internal orthopedic prosthetic devices, implants and grafts, sequela: Secondary | ICD-10-CM

## 2014-05-27 HISTORY — DX: Unspecified asthma, uncomplicated: J45.909

## 2014-05-27 HISTORY — PX: HARDWARE REMOVAL: SHX979

## 2014-05-27 LAB — CBC
HEMATOCRIT: 41.8 % (ref 39.0–52.0)
Hemoglobin: 14.3 g/dL (ref 13.0–17.0)
MCH: 29.9 pg (ref 26.0–34.0)
MCHC: 34.2 g/dL (ref 30.0–36.0)
MCV: 87.4 fL (ref 78.0–100.0)
Platelets: 219 10*3/uL (ref 150–400)
RBC: 4.78 MIL/uL (ref 4.22–5.81)
RDW: 12.7 % (ref 11.5–15.5)
WBC: 4.2 10*3/uL (ref 4.0–10.5)

## 2014-05-27 SURGERY — REMOVAL, HARDWARE
Anesthesia: Regional | Site: Ankle | Laterality: Left

## 2014-05-27 MED ORDER — BUPIVACAINE HCL (PF) 0.25 % IJ SOLN
INTRAMUSCULAR | Status: AC
  Filled 2014-05-27: qty 10

## 2014-05-27 MED ORDER — MIDAZOLAM HCL 2 MG/2ML IJ SOLN
1.0000 mg | INTRAMUSCULAR | Status: DC | PRN
  Administered 2014-05-27: 2 mg via INTRAVENOUS

## 2014-05-27 MED ORDER — FENTANYL CITRATE 0.05 MG/ML IJ SOLN
INTRAMUSCULAR | Status: AC
  Administered 2014-05-27: 100 ug via INTRAVENOUS
  Filled 2014-05-27: qty 2

## 2014-05-27 MED ORDER — DEXTROSE 5 % IV SOLN
INTRAVENOUS | Status: DC | PRN
  Administered 2014-05-27: 15:00:00 via INTRAVENOUS

## 2014-05-27 MED ORDER — METHYLPREDNISOLONE ACETATE 40 MG/ML IJ SUSP
INTRAMUSCULAR | Status: AC
  Filled 2014-05-27: qty 1

## 2014-05-27 MED ORDER — PROPOFOL 10 MG/ML IV BOLUS
INTRAVENOUS | Status: AC
  Filled 2014-05-27: qty 20

## 2014-05-27 MED ORDER — METHYLPREDNISOLONE ACETATE 40 MG/ML IJ SUSP
INTRAMUSCULAR | Status: DC | PRN
  Administered 2014-05-27: 3 mL via INTRAMUSCULAR

## 2014-05-27 MED ORDER — FENTANYL CITRATE 0.05 MG/ML IJ SOLN
50.0000 ug | INTRAMUSCULAR | Status: DC | PRN
  Administered 2014-05-27: 100 ug via INTRAVENOUS

## 2014-05-27 MED ORDER — OXYCODONE HCL 5 MG PO TABS: 5.0000 mg | ORAL_TABLET | Freq: Once | ORAL | Status: DC | PRN

## 2014-05-27 MED ORDER — ONDANSETRON HCL 4 MG/2ML IJ SOLN
INTRAMUSCULAR | Status: AC
  Filled 2014-05-27: qty 2

## 2014-05-27 MED ORDER — MIDAZOLAM HCL 2 MG/2ML IJ SOLN
INTRAMUSCULAR | Status: AC
  Administered 2014-05-27: 2 mg via INTRAVENOUS
  Filled 2014-05-27: qty 2

## 2014-05-27 MED ORDER — ONDANSETRON HCL 4 MG/2ML IJ SOLN: 4.0000 mg | Freq: Once | INTRAMUSCULAR | Status: DC | PRN

## 2014-05-27 MED ORDER — LIDOCAINE HCL (CARDIAC) 20 MG/ML IV SOLN
INTRAVENOUS | Status: DC | PRN
  Administered 2014-05-27: 50 mg via INTRAVENOUS

## 2014-05-27 MED ORDER — LACTATED RINGERS IV SOLN
INTRAVENOUS | Status: DC | PRN
  Administered 2014-05-27 (×2): via INTRAVENOUS

## 2014-05-27 MED ORDER — HYDROMORPHONE HCL 1 MG/ML IJ SOLN: 0.2500 mg | INTRAMUSCULAR | Status: DC | PRN

## 2014-05-27 MED ORDER — 0.9 % SODIUM CHLORIDE (POUR BTL) OPTIME
TOPICAL | Status: DC | PRN
  Administered 2014-05-27: 1000 mL

## 2014-05-27 MED ORDER — HYDROCODONE-ACETAMINOPHEN 5-325 MG PO TABS: 1.0000 | ORAL_TABLET | ORAL | Status: AC | PRN

## 2014-05-27 MED ORDER — OXYCODONE HCL 5 MG/5ML PO SOLN: 5.0000 mg | Freq: Once | ORAL | Status: DC | PRN

## 2014-05-27 MED ORDER — PROPOFOL 10 MG/ML IV BOLUS
INTRAVENOUS | Status: DC | PRN
  Administered 2014-05-27: 180 mg via INTRAVENOUS

## 2014-05-27 MED ORDER — MIDAZOLAM HCL 2 MG/2ML IJ SOLN
INTRAMUSCULAR | Status: AC
  Filled 2014-05-27: qty 2

## 2014-05-27 MED ORDER — METHYLPREDNISOLONE ACETATE 80 MG/ML IJ SUSP
INTRAMUSCULAR | Status: AC
  Filled 2014-05-27: qty 1

## 2014-05-27 MED ORDER — ROCURONIUM BROMIDE 50 MG/5ML IV SOLN
INTRAVENOUS | Status: AC
  Filled 2014-05-27: qty 1

## 2014-05-27 MED ORDER — FENTANYL CITRATE 0.05 MG/ML IJ SOLN
INTRAMUSCULAR | Status: AC
  Filled 2014-05-27: qty 5

## 2014-05-27 MED ORDER — LIDOCAINE HCL (CARDIAC) 20 MG/ML IV SOLN
INTRAVENOUS | Status: AC
  Filled 2014-05-27: qty 5

## 2014-05-27 MED ORDER — ONDANSETRON HCL 4 MG/2ML IJ SOLN
INTRAMUSCULAR | Status: DC | PRN
  Administered 2014-05-27: 4 mg via INTRAVENOUS

## 2014-05-27 MED ORDER — LACTATED RINGERS IV SOLN
INTRAVENOUS | Status: DC
  Administered 2014-05-27: 11:00:00 via INTRAVENOUS

## 2014-05-27 MED ORDER — SUCCINYLCHOLINE CHLORIDE 20 MG/ML IJ SOLN
INTRAMUSCULAR | Status: AC
  Filled 2014-05-27: qty 1

## 2014-05-27 SURGICAL SUPPLY — 54 items
BANDAGE ELASTIC 4 VELCRO ST LF (GAUZE/BANDAGES/DRESSINGS) ×3 IMPLANT
BANDAGE ELASTIC 6 VELCRO ST LF (GAUZE/BANDAGES/DRESSINGS) IMPLANT
BANDAGE ESMARK 6X9 LF (GAUZE/BANDAGES/DRESSINGS) IMPLANT
BENZOIN TINCTURE PRP APPL 2/3 (GAUZE/BANDAGES/DRESSINGS) ×3 IMPLANT
BNDG COHESIVE 4X5 TAN STRL (GAUZE/BANDAGES/DRESSINGS) IMPLANT
BNDG ESMARK 6X9 LF (GAUZE/BANDAGES/DRESSINGS)
BNDG GAUZE ELAST 4 BULKY (GAUZE/BANDAGES/DRESSINGS) ×3 IMPLANT
CLOSURE STERI-STRIP 1/4X4 (GAUZE/BANDAGES/DRESSINGS) ×3 IMPLANT
COVER SURGICAL LIGHT HANDLE (MISCELLANEOUS) ×3 IMPLANT
CUFF TOURNIQUET SINGLE 34IN LL (TOURNIQUET CUFF) IMPLANT
CUFF TOURNIQUET SINGLE 44IN (TOURNIQUET CUFF) IMPLANT
DECANTER SPIKE VIAL GLASS SM (MISCELLANEOUS) ×3 IMPLANT
DRAPE C-ARM 42X72 X-RAY (DRAPES) IMPLANT
DRAPE EXTREMITY T 121X128X90 (DRAPE) IMPLANT
DRAPE INCISE IOBAN 66X45 STRL (DRAPES) IMPLANT
DRAPE ORTHO SPLIT 77X108 STRL (DRAPES)
DRAPE PROXIMA HALF (DRAPES) IMPLANT
DRAPE SURG ORHT 6 SPLT 77X108 (DRAPES) IMPLANT
DRSG EMULSION OIL 3X3 NADH (GAUZE/BANDAGES/DRESSINGS) IMPLANT
DRSG PAD ABDOMINAL 8X10 ST (GAUZE/BANDAGES/DRESSINGS) IMPLANT
ELECT REM PT RETURN 9FT ADLT (ELECTROSURGICAL) ×3
ELECTRODE REM PT RTRN 9FT ADLT (ELECTROSURGICAL) ×1 IMPLANT
GAUZE SPONGE 4X4 12PLY STRL (GAUZE/BANDAGES/DRESSINGS) ×3 IMPLANT
GLOVE BIO SURGEON STRL SZ8 (GLOVE) ×3 IMPLANT
GLOVE BIOGEL PI IND STRL 6.5 (GLOVE) ×1 IMPLANT
GLOVE BIOGEL PI IND STRL 8 (GLOVE) ×2 IMPLANT
GLOVE BIOGEL PI INDICATOR 6.5 (GLOVE) ×2
GLOVE BIOGEL PI INDICATOR 8 (GLOVE) ×4
GLOVE ORTHO TXT STRL SZ7.5 (GLOVE) ×3 IMPLANT
GOWN STRL REUS W/ TWL LRG LVL3 (GOWN DISPOSABLE) ×3 IMPLANT
GOWN STRL REUS W/ TWL XL LVL3 (GOWN DISPOSABLE) ×2 IMPLANT
GOWN STRL REUS W/TWL LRG LVL3 (GOWN DISPOSABLE) ×6
GOWN STRL REUS W/TWL XL LVL3 (GOWN DISPOSABLE) ×4
KIT BASIN OR (CUSTOM PROCEDURE TRAY) ×3 IMPLANT
KIT ROOM TURNOVER OR (KITS) ×3 IMPLANT
MANIFOLD NEPTUNE II (INSTRUMENTS) IMPLANT
NS IRRIG 1000ML POUR BTL (IV SOLUTION) ×3 IMPLANT
PACK GENERAL/GYN (CUSTOM PROCEDURE TRAY) ×3 IMPLANT
PAD ARMBOARD 7.5X6 YLW CONV (MISCELLANEOUS) ×6 IMPLANT
PAD CAST 4YDX4 CTTN HI CHSV (CAST SUPPLIES) IMPLANT
PADDING CAST COTTON 4X4 STRL (CAST SUPPLIES)
SPONGE GAUZE 4X4 12PLY STER LF (GAUZE/BANDAGES/DRESSINGS) ×3 IMPLANT
STAPLER VISISTAT 35W (STAPLE) IMPLANT
STOCKINETTE IMPERVIOUS 9X36 MD (GAUZE/BANDAGES/DRESSINGS) IMPLANT
SUT ETHILON 2 0 PSLX (SUTURE) ×3 IMPLANT
SUT ETHILON 4 0 FS 1 (SUTURE) IMPLANT
SUT MNCRL AB 4-0 PS2 18 (SUTURE) ×3 IMPLANT
SUT VIC AB 0 CT1 27 (SUTURE)
SUT VIC AB 0 CT1 27XBRD ANBCTR (SUTURE) IMPLANT
SUT VIC AB 2-0 CT1 27 (SUTURE)
SUT VIC AB 2-0 CT1 TAPERPNT 27 (SUTURE) IMPLANT
TOWEL OR 17X24 6PK STRL BLUE (TOWEL DISPOSABLE) ×3 IMPLANT
TOWEL OR 17X26 10 PK STRL BLUE (TOWEL DISPOSABLE) ×3 IMPLANT
WATER STERILE IRR 1000ML POUR (IV SOLUTION) IMPLANT

## 2014-05-27 NOTE — Transfer of Care (Signed)
Immediate Anesthesia Transfer of Care Note  Patient: [name]  Procedure(s) Performed: Procedure(s): Removal of plate/screws left ankle, steroid injection left ankle (Left)  Patient Location: PACU  Anesthesia Type:General  Level of Consciousness: awake, alert , oriented and patient cooperative  Airway & Oxygen Therapy: Patient Spontanous Breathing and Patient connected to nasal cannula oxygen  Post-op Assessment: Report given to PACU RN, Post -op Vital signs reviewed and stable and Patient moving all extremities  Post vital signs: Reviewed and stable  Complications: No apparent anesthesia complications

## 2014-05-27 NOTE — H&P (Signed)
Brian Carney is an 28 y.o. male.   Chief Complaint:   Prominent hardware left lateral ankle HPI:   28 yo male who injured his left ankle in April of this year.  Underwent ORIF of his syndesmosis.  Now presents for removal of prominent, painful left ankle hardware.  Past Medical History  Diagnosis Date  . Asthma     AS A CHILD, no problem since age 28.    Past Surgical History  Procedure Laterality Date  . Fracture surgery  05/2013    right ring finger - pins  . Orif ankle fracture Left 09/19/2013    Procedure: OPEN REDUCTIN INTERNAL FIXATION LEFT SYNDESMOSIS ;  Surgeon: Brian PalmerMichael H Handy, MD;  Location: [facility] OR;  Service: Orthopedics;  Laterality: Left;  . Arthrotomy Left 09/19/2013    Procedure: ANKLE ARTHROTOMY;  Surgeon: Brian PalmerMichael H Handy, MD;  [facility];  Service: Orthopedics;  Laterality: Left;    Family History  Problem Relation Age of Onset  . Hypertension Mother    Social History:  reports that he has never smoked. He has never used smokeless tobacco. He reports that he does not drink alcohol or use illicit drugs.  Allergies:  Allergies  Allergen Reactions  . Sulfa Antibiotics Swelling    Medications Prior to Admission  Medication Sig Dispense Refill  . AFLURIA PRESERVATIVE FREE 0.5 ML SUSY Inject 0.5 mg as directed once.  0  . ibuprofen (ADVIL,MOTRIN) 200 MG tablet Take 200 mg by mouth every 6 (six) hours as needed.    . methocarbamol (ROBAXIN) 500 MG tablet Take 1 tablet (500 mg total) by mouth every 6 (six) hours as needed for muscle spasms. (Patient not taking: Reported on 05/21/2014) 80 tablet 0  . ondansetron (ZOFRAN) 4 MG tablet Take 1-2 tablets (4-8 mg total) by mouth every 8 (eight) hours as needed for nausea or vomiting. (Patient not taking: Reported on 05/21/2014) 20 tablet 0  . oxyCODONE (ROXICODONE) 5 MG immediate release tablet Take 1-2 tablets (5-10 mg total) by mouth every 3 (three) hours as needed for breakthrough pain (take between percocet for  breakthrough pain). (Patient not taking: Reported on 05/21/2014) 30 tablet 0  . oxyCODONE-acetaminophen (ROXICET) 5-325 MG per tablet Take 1-2 tablets by mouth every 6 (six) hours as needed for moderate pain or severe pain. (Patient not taking: Reported on 05/21/2014) 80 tablet 0    Results for orders placed or performed during the hospital encounter of 05/27/14 (from the past 48 hour(s))  CBC     Status: None   Collection Time: 05/27/14 11:05 AM  Result Value Ref Range   WBC 4.2 4.0 - 10.5 K/uL   RBC 4.78 4.22 - 5.81 MIL/uL   Hemoglobin 14.3 13.0 - 17.0 g/dL   HCT 16.141.8 09.639.0 - 04.552.0 %   MCV 87.4 78.0 - 100.0 fL   MCH 29.9 26.0 - 34.0 pg   MCHC 34.2 30.0 - 36.0 g/dL   RDW 40.912.7 81.111.5 - 91.415.5 %   Platelets 219 150 - 400 K/uL   No results found.  Review of Systems  All other systems reviewed and are negative.   Blood pressure 139/83, pulse 69, temperature 98 F (36.7 C), temperature source Oral, resp. rate 16, height 5\' 8"  (1.727 m), weight 80.854 kg (178 lb 4 oz), SpO2 98 %. Physical Exam  Constitutional: He is oriented to person, place, and time. He appears well-developed and well-nourished.  HENT:  Head: Normocephalic and atraumatic.  Eyes: EOM are normal. Pupils are equal,  round, and reactive to light.  Neck: Normal range of motion. Neck supple.  Cardiovascular: Normal rate and regular rhythm.   Respiratory: Effort normal and breath sounds normal.  GI: Soft. Bowel sounds are normal.  Musculoskeletal:       Feet:  Neurological: He is alert and oriented to person, place, and time.  Skin: Skin is warm and dry.  Psychiatric: He has a normal mood and affect.     Assessment/Plan Prominent, painful hardware left ankle  1)  To the OR today as an outpatient for hardware removal from his left ankle lateral malleolus and a steroid injection into his left ankle joint  Brian Carney Y 05/27/2014, 11:54 AM

## 2014-05-27 NOTE — Anesthesia Postprocedure Evaluation (Signed)
  Anesthesia Post-op Note  Patient: Brian Carney  Procedure(s) Performed: Procedure(s): Removal of plate/screws left ankle, steroid injection left ankle (Left)  Patient Location: PACU  Anesthesia Type:General and GA combined with regional for post-op pain  Level of Consciousness: awake, alert  and oriented  Airway and Oxygen Therapy: Patient Spontanous Breathing and Patient connected to nasal cannula oxygen  Post-op Pain: none  Post-op Assessment: Post-op Vital signs reviewed, Patient's Cardiovascular Status Stable, Respiratory Function Stable, Patent Airway, No signs of Nausea or vomiting and Pain level controlled  Post-op Vital Signs: stable  Last Vitals:  Filed Vitals:   05/27/14 1645  BP: 138/80  Pulse: 83  Temp:   Resp: 15    Complications: No apparent anesthesia complications

## 2014-05-27 NOTE — Anesthesia Procedure Notes (Addendum)
Procedure Name: LMA Insertion Date/Time: 05/27/2014 3:11 PM Performed by: Darcey NoraJAMES, KAREN B Pre-anesthesia Checklist: Patient identified, Emergency Drugs available, Suction available and Patient being monitored Patient Re-evaluated:Patient Re-evaluated prior to inductionOxygen Delivery Method: Circle system utilized Preoxygenation: Pre-oxygenation with 100% oxygen Intubation Type: IV induction Ventilation: Mask ventilation without difficulty LMA: LMA inserted LMA Size: 5.0 Placement Confirmation: breath sounds checked- equal and bilateral and positive ETCO2 Tube secured with: Tape (taped across cheeks to clear skin behind beard) Dental Injury: Teeth and Oropharynx as per pre-operative assessment    Anesthesia Regional Block:  Popliteal block  Pre-Anesthetic Checklist: ,, timeout performed, Correct Patient, Correct Site, Correct Laterality, Correct Procedure, Correct Position, site marked, Risks and benefits discussed,  Surgical consent,  Pre-op evaluation,  At surgeon's request and post-op pain management  Laterality: Left  Prep: chloraprep       Needles:  Injection technique: Single-shot  Needle Type: Echogenic Stimulator Needle     Needle Length: 9cm 9 cm Needle Gauge: 22 and 22 G    Additional Needles:  Procedures: ultrasound guided (picture in chart) and nerve stimulator Popliteal block Narrative:  Start time: 05/27/2014 3:10 PM End time: 05/27/2014 6:15 PM Injection made incrementally with aspirations every 5 mL.  Performed by: Personally   Additional Notes: 30 cc 0.5% Marcaine with 1:200 Epi injected easily

## 2014-05-27 NOTE — Brief Op Note (Signed)
05/27/2014  3:49 PM  PATIENT:  Michaela CornerKelvin Polidore  28 y.o. male  PRE-OPERATIVE DIAGNOSIS:  retained, prominent, painful hardware left ankle  POST-OPERATIVE DIAGNOSIS:  retained prominent - painful hardware left ankle  PROCEDURE:  Procedure(s): Removal of plate/screws left ankle, steroid injection left ankle (Left)  SURGEON:  Surgeon(s) and Role:    * Kathryne Hitchhristopher Y Aleta Manternach, MD - Primary  PHYSICIAN ASSISTANT: Rexene EdisonGil Clark, PA-C  ANESTHESIA:   regional and general  EBL:  Total I/O In: 1000 [I.V.:1000] Out: -   BLOOD ADMINISTERED:none  DRAINS: none   LOCAL MEDICATIONS USED:  NONE  SPECIMEN:  No Specimen  DISPOSITION OF SPECIMEN:  N/A  COUNTS:  YES  TOURNIQUET:  * No tourniquets in log *  DICTATION: .Other Dictation: Dictation Number [id number]  PLAN OF CARE: Discharge to home after PACU  PATIENT DISPOSITION:  PACU - hemodynamically stable.   Delay start of Pharmacological VTE agent (>24hrs) due to surgical blood loss or risk of bleeding: not applicable

## 2014-05-27 NOTE — Discharge Instructions (Signed)
Increase your activities as comfort allows. You can put your full weight on your left ankle. You should keep your current dressing clean and dry. You can remove your dressing in 3 days, but leave the steri-strips in place. You can get your foot/ankle wet in the shower in 3-4 days.  What to eat:  For your first meals, you should eat lightly; only small meals initially.  If you do not have nausea, you may eat larger meals.  Avoid spicy, greasy and heavy food.    General Anesthesia, Adult, Care After  Refer to this sheet in the next few weeks. These instructions provide you with information on caring for yourself after your procedure. Your health care provider may also give you more specific instructions. Your treatment has been planned according to current medical practices, but problems sometimes occur. Call your health care provider if you have any problems or questions after your procedure.  WHAT TO EXPECT AFTER THE PROCEDURE  After the procedure, it is typical to experience:  Sleepiness.  Nausea and vomiting. HOME CARE INSTRUCTIONS  For the first 24 hours after general anesthesia:  Have a responsible person with you.  Do not drive a car. If you are alone, do not take public transportation.  Do not drink alcohol.  Do not take medicine that has not been prescribed by your health care provider.  Do not sign important papers or make important decisions.  You may resume a normal diet and activities as directed by your health care provider.  Change bandages (dressings) as directed.  If you have questions or problems that seem related to general anesthesia, call the hospital and ask for the anesthetist or anesthesiologist on call. SEEK MEDICAL CARE IF:  You have nausea and vomiting that continue the day after anesthesia.  You develop a rash. SEEK IMMEDIATE MEDICAL CARE IF:  You have difficulty breathing.  You have chest pain.  You have any allergic problems. Document Released: 08/22/2000  Document Revised: 01/16/2013 Document Reviewed: 11/29/2012  [facility] Patient Information 2014 [place].

## 2014-05-27 NOTE — Progress Notes (Signed)
Orthopedic Tech Progress Note Patient Details:  Brian CornerKelvin Carney 06/29/1985 [id number] Delivered crutches to pt.'s nurse.  Patient ID: Brian CornerKelvin Carney, male   DOB: 06/29/1985, 28 y.o.   MRN: [id number]   [name], [name] 05/27/2014, 4:35 PM

## 2014-05-27 NOTE — Anesthesia Preprocedure Evaluation (Signed)
Anesthesia Evaluation  Patient identified by MRN, date of birth, ID band Patient awake    Reviewed: Allergy & Precautions, H&P , NPO status , Patient's Chart, lab work & pertinent test results  Airway Mallampati: II  TM Distance: >3 FB Neck ROM: Full    Dental  (+) Teeth Intact, Dental Advisory Given   Pulmonary          Cardiovascular     Neuro/Psych    GI/Hepatic   Endo/Other    Renal/GU      Musculoskeletal   Abdominal   Peds  Hematology   Anesthesia Other Findings Asthma as a child, no problems as an adult.   Reproductive/Obstetrics                             Anesthesia Physical Anesthesia Plan  ASA: I  Anesthesia Plan: General and Regional   Post-op Pain Management:    Induction: Intravenous  Airway Management Planned: LMA and Oral ETT  Additional Equipment:   Intra-op Plan:   Post-operative Plan: Extubation in OR  Informed Consent: I have reviewed the patients History and Physical, chart, labs and discussed the procedure including the risks, benefits and alternatives for the proposed anesthesia with the patient or authorized representative who has indicated his/her understanding and acceptance.   Dental advisory given  Plan Discussed with: CRNA, Anesthesiologist and Surgeon  Anesthesia Plan Comments:         Anesthesia Quick Evaluation

## 2014-05-27 NOTE — Op Note (Signed)
NAME:  Brian Carney, Banner               ACCOUNT NO.:  [id number]  MEDICAL RECORD NO.:  [id number]  LOCATION:  [facility]                         FACILITY:  [facility]  PHYSICIAN:  Vanita PandaChristopher Y. Magnus IvanBlackman, M.D.DATE OF BIRTH:  02-20-1986  DATE OF PROCEDURE:  05/27/2014 DATE OF DISCHARGE:  05/27/2014                              OPERATIVE REPORT   PREOPERATIVE DIAGNOSIS:  Retained prominent syndesmosis screws and plate, left ankle.  POSTOPERATIVE DIAGNOSIS:  Retained prominent syndesmosis screws and plate, left ankle.  PROCEDURES: 1. Removal of two screws and plate from left ankle lateral malleolus. 2. Steroid injection with 1 mL of Depo-Medrol mixed with 2 mL of 0.25%     plain Marcaine into the left ankle joint.  SURGEON:  Vanita PandaChristopher Y. Magnus IvanBlackman, M.D.  ASSISTANT:  Richardean CanalGilbert Clark, PA-C.  ANESTHESIA: 1. Left leg popliteal block. 2. General anesthesia via LMA.  BLOOD LOSS:  Less than 50 mL.  COMPLICATIONS:  None.  INDICATIONS:  Brian CollegeKelvin is a 28 year old gentleman who in April of this year sustained a left ankle injury, which was mainly a syndesmosis injury.  He had two syndesmosis screws placed along the one-third semitubular plate to the lateral malleolus into the tibia capturing three cortices.  This was done by another surgeon in town and the patient did quite well.  He ended up even breaking the screws, but got his motion back and has been very satisfied.  However, he has been think he has developed prominent hardware on the lateral aspect of his ankle and is affecting the shoe wear.  X-ray shows syndesmosis has heeled and his ankle mortise is intact and stressing the ankle mortise is intact. However, he can palpate these screws on the lateral aspect of his ankle and he wishes to have these removed and definitely appropriate at this point given the hardware prominence.  PROCEDURE DESCRIPTION:  After informed consent was obtained, appropriate left ankle was marked.  Anesthesia obtained  a popliteal block.  He was then brought to the operating room, placed supine on the operating table.  General anesthesia was obtained via an LMA.  A bump was placed under his left hip and his left foot and ankle were prepped and draped with DuraPrep and sterile drapes.  A time-out was called and he was identified as correct patient and correct left ankle, I then can easily palpate the two heads of the screw.  I made a small incision over his previous incision and dissected down the screws and removed the two screws and the plate easily.  I stressed the ankle again and the mortise was intact and the ankle was nice and stable clinically.  We then irrigated the soft tissue with normal saline solution, closed the deep tissue with 2-0 Vicryl suture followed by 2-0 Vicryl in the subcutaneous tissue, 4-0 Monocryl subcuticular stitch and Steri-Strips on the skin. We then placed an injection of 1 mL of Depo-Medrol mixed with 2 mL of 0.25% plain Marcaine in the ankle joint itself.  Well-padded sterile dressing was applied and he was awakened, extubated and was taken to the recovery room in stable condition.  All final counts were correct and there were no complications noted.  Postoperatively, I will  let him weightbear as tolerated.  Once the block wears off, then he will follow up in the office in 2 weeks.  He understands the stay out of contact sports for the next 2 weeks.     Vanita Pandahristopher Y. Magnus IvanBlackman, M.D.     CYB/MEDQ  D:  05/27/2014  T:  05/27/2014  Job:  [id number]

## 2014-05-28 ENCOUNTER — Encounter (HOSPITAL_COMMUNITY): Payer: Self-pay | Admitting: Orthopaedic Surgery

## 2015-09-19 IMAGING — CR DG TIBIA/FIBULA 2V*L*
4 series · 4 of 4 positions shown · non-contrast
Comparison: None.

CLINICAL DATA: Accident during cross fit training.

EXAM:
LEFT TIBIA AND FIBULA - 2 VIEW

[t tib/fib lat left (1 of 2)]
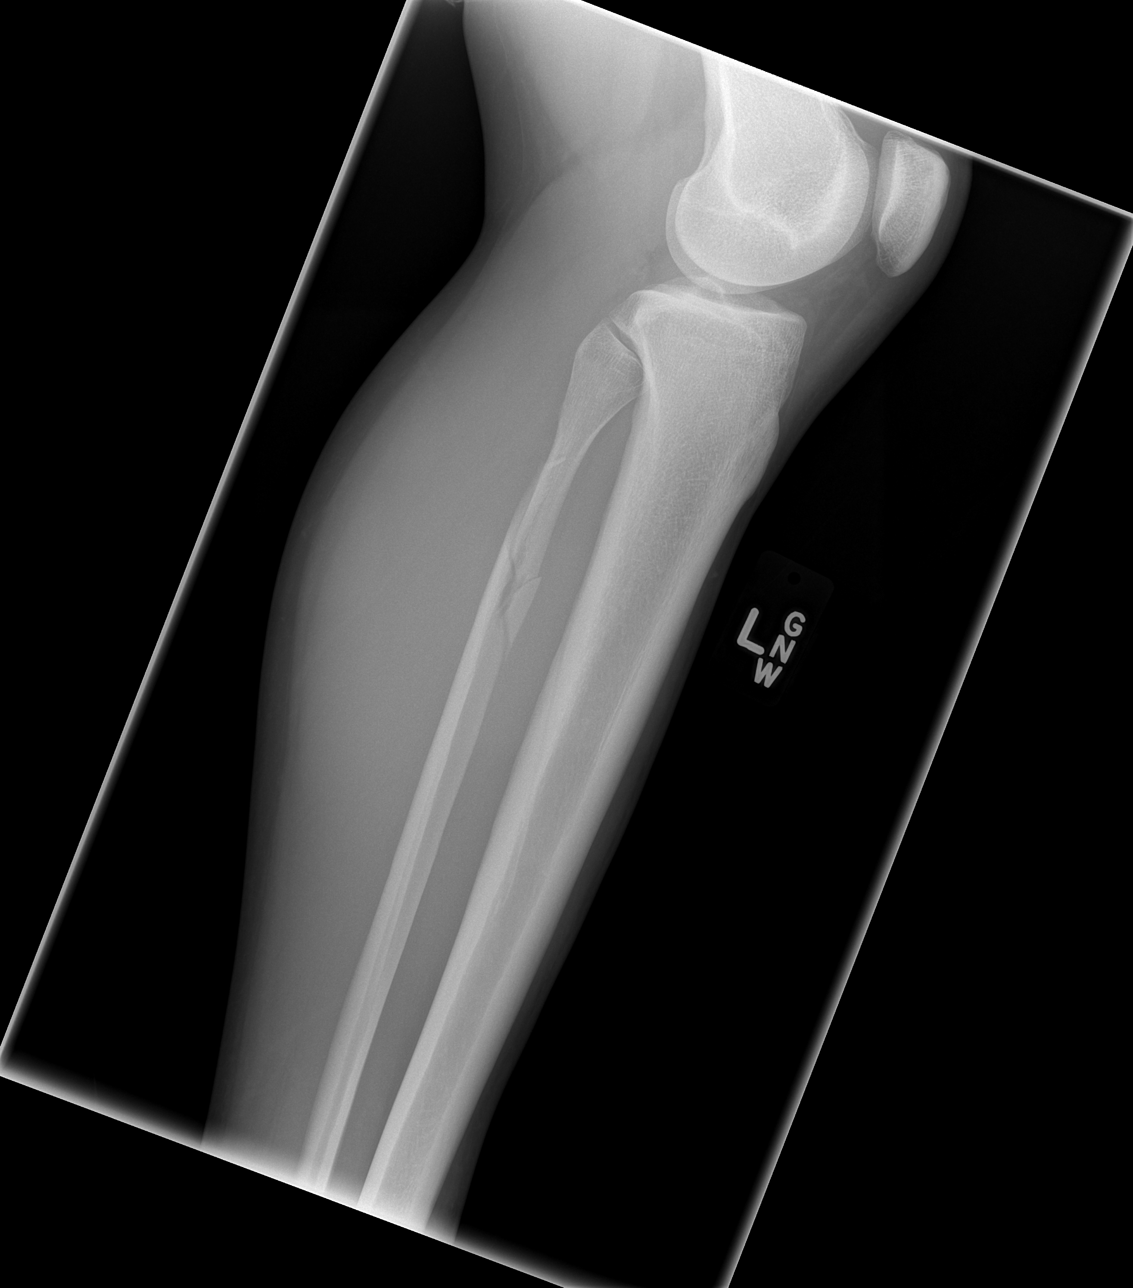

[t tib/fib lat left (2 of 2)]
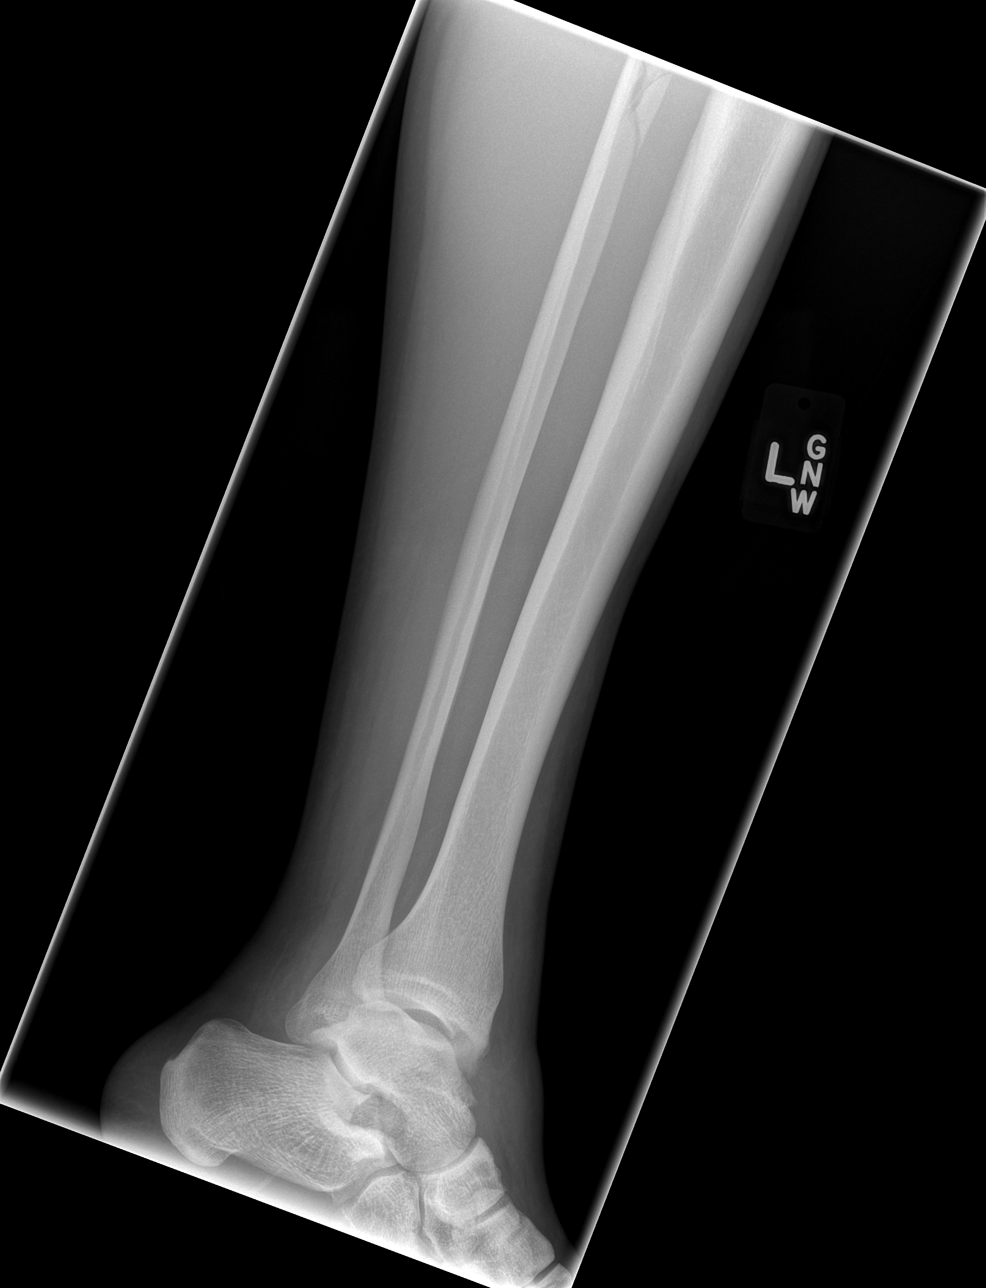

[t tib/fib ap left (1 of 2)]
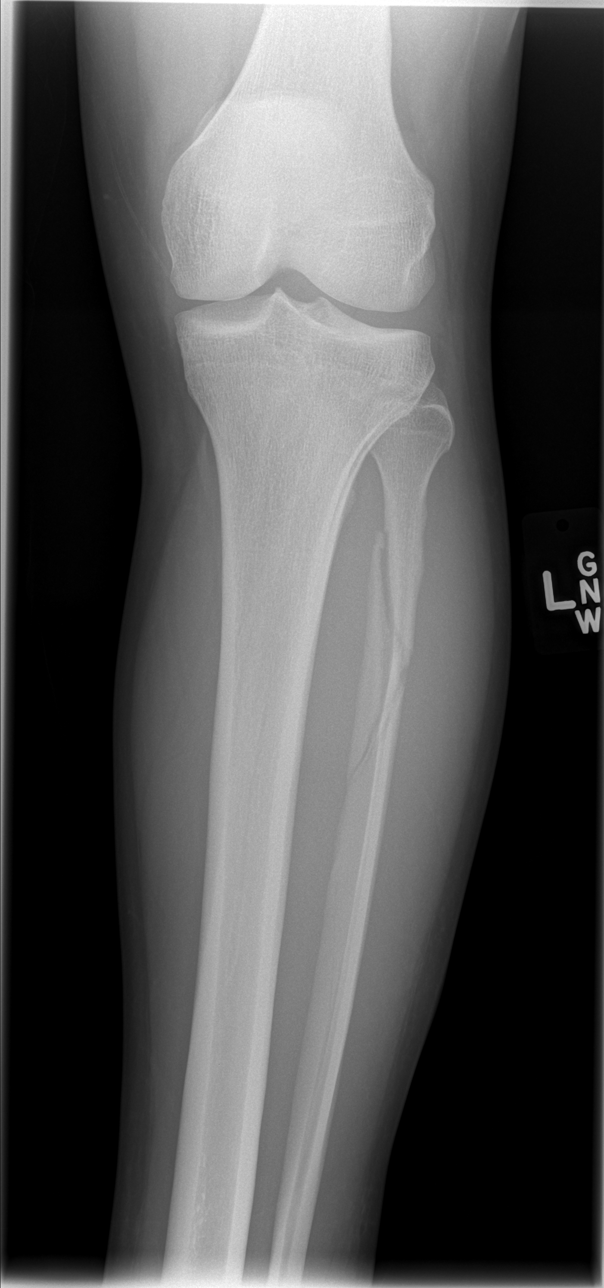

[t tib/fib ap left (2 of 2)]
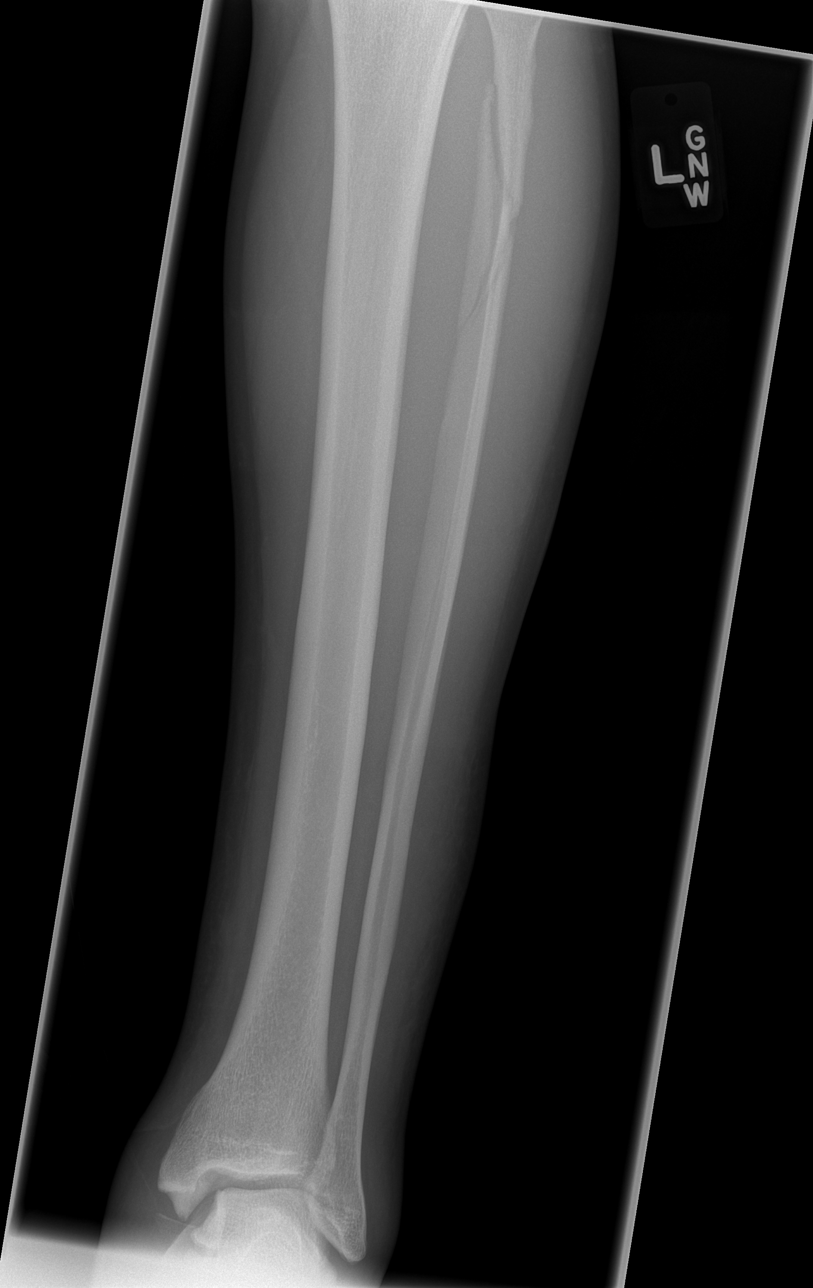

[4 of 4 positions shown; findings below may reference images not displayed]

FINDINGS: Minimally displaced, comminuted fracture involves the proximal left
fibula. Displaced fracture of the medial malleolus with associated
soft tissue swelling is again noted.
IMPRESSION: 1. Proximal fibular fracture.
2. Medial malleolar fracture.
